# Patient Record
Sex: Male | Born: 1980
Health system: Southern US, Community
[De-identification: ages and names within clinical notes are randomized; demographics above are authoritative.]

## PROBLEM LIST (undated history)

## (undated) DIAGNOSIS — T7840XA Allergy, unspecified, initial encounter: Secondary | ICD-10-CM

## (undated) HISTORY — DX: Allergy, unspecified, initial encounter: T78.40XA

## (undated) HISTORY — PX: TONSILLECTOMY: SUR1361

## (undated) HISTORY — PX: WISDOM TOOTH EXTRACTION: SHX21

---

## 2006-06-22 DIAGNOSIS — L309 Dermatitis, unspecified: Secondary | ICD-10-CM | POA: Insufficient documentation

## 2010-09-12 ENCOUNTER — Inpatient Hospital Stay (INDEPENDENT_AMBULATORY_CARE_PROVIDER_SITE_OTHER)
Admission: RE | Admit: 2010-09-12 | Discharge: 2010-09-12 | Disposition: A | Discharge status: Home / Self Care | Payer: 59 | Source: Ambulatory Visit | Attending: Family Medicine | Admitting: Family Medicine

## 2010-09-12 DIAGNOSIS — R197 Diarrhea, unspecified: Secondary | ICD-10-CM

## 2010-09-12 LAB — POCT I-STAT, CHEM 8
Creatinine, Ser: 1.1 mg/dL (ref 0.4–1.5)
HCT: 45 % (ref 39.0–52.0)
Hemoglobin: 15.3 g/dL (ref 13.0–17.0)
Potassium: 4.1 mEq/L (ref 3.5–5.1)
Sodium: 140 mEq/L (ref 135–145)
TCO2: 28 mmol/L (ref 0–100)

## 2010-09-12 LAB — POCT URINALYSIS DIP (DEVICE)
Glucose, UA: NEGATIVE mg/dL
Nitrite: NEGATIVE
Protein, ur: NEGATIVE mg/dL
Urobilinogen, UA: 0.2 mg/dL (ref 0.0–1.0)

## 2014-03-08 ENCOUNTER — Encounter: Payer: Self-pay | Admitting: Family Medicine

## 2014-03-08 ENCOUNTER — Ambulatory Visit (INDEPENDENT_AMBULATORY_CARE_PROVIDER_SITE_OTHER): Payer: 59 | Admitting: Family Medicine

## 2014-03-08 VITALS — BP 98/68 | HR 64 | Temp 98.1°F | Ht 65.0 in | Wt 155.6 lb

## 2014-03-08 DIAGNOSIS — Z0000 Encounter for general adult medical examination without abnormal findings: Secondary | ICD-10-CM

## 2014-03-08 DIAGNOSIS — J309 Allergic rhinitis, unspecified: Secondary | ICD-10-CM | POA: Insufficient documentation

## 2014-03-08 DIAGNOSIS — Z7689 Persons encountering health services in other specified circumstances: Secondary | ICD-10-CM

## 2014-03-08 DIAGNOSIS — J3089 Other allergic rhinitis: Secondary | ICD-10-CM

## 2014-03-08 LAB — LIPID PANEL
CHOLESTEROL: 213 mg/dL — AB (ref 0–200)
HDL: 39.3 mg/dL (ref >39.00)
LDL CALC: 139 mg/dL — AB (ref 0–99)
NonHDL: 173.7
Total CHOL/HDL Ratio: 5
Triglycerides: 176 mg/dL — ABNORMAL HIGH (ref 0.0–149.0)
VLDL: 35.2 mg/dL (ref 0.0–40.0)

## 2014-03-08 LAB — HEMOGLOBIN A1C: HEMOGLOBIN A1C: 5.1 % (ref 4.6–6.5)

## 2014-03-08 NOTE — Progress Notes (Signed)
Pre visit review using our clinic review tool, if applicable. No additional management support is needed unless otherwise documented below in the visit note. 

## 2014-03-08 NOTE — Progress Notes (Signed)
HPI:  Robert Roman is here to establish care. Wife advised him to get a family doctor. Wants general physical labs. Reports exercises on a regular basis and eats a healthy diet. UTD on vaccines and does declines STI testing. Is fasting today. Last PCP and physical: 4 years ago  Has the following chronic problems that require follow up and concerns today:  Seasonal Allergies: -uses Claritin for this -chronic nasal congestion, sneezing, itchy throat - used to see an ENT  ROS negative for unless reported above: fevers, unintentional weight loss, hearing or vision loss, chest pain, palpitations, struggling to breath, hemoptysis, melena, hematochezia, hematuria, falls, loc, si, thoughts of self harm  Past Medical History  Diagnosis Date  . Allergy     History reviewed. No pertinent past surgical history.  Family History  Problem Relation Age of Onset  . Thyroid disease Mother   . Irritable bowel syndrome Mother   . Hypertension Sister   . Heart disease Maternal Grandfather   . Heart disease Paternal Grandfather     History   Social History  . Marital Status: Married    Spouse Name: N/A    Number of Children: N/A  . Years of Education: N/A   Social History Main Topics  . Smoking status: Never Smoker   . Smokeless tobacco: None     Comment: smoked as a teenager  . Alcohol Use: 0.0 oz/week    0 Not specified per week     Comment: 2-3 drinks a few days per week  . Drug Use: No  . Sexual Activity: None   Other Topics Concern  . 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  GENERAL: vitals reviewed and listed above, alert, oriented, appears well hydrated and in no acute distress  HEENT: atraumatic, conjunttiva clear, no obvious abnormalities on inspection of external nose and ears  NECK: no obvious masses on inspection  LUNGS: clear to auscultation bilaterally, no wheezes, rales or rhonchi, good air movement  CV: HRRR, no peripheral   GU: declined  Rectal: declined  ABD: BS+, soft, NTTP  SKin: normal, reports he saw dermatologist for skin check recently  MS: moves all extremities without noticeable abnormality  PSYCH: pleasant and cooperative, no obvious depression or anxiety  ASSESSMENT AND PLAN:  Discussed the following assessment and plan:  Visit for preventive health examination - Plan: Lipid Panel, Hemoglobin A1c  Encounter to establish care  Other allergic rhinitis  -We reviewed the PMH, PSH, FH, SH, Meds and Allergies. -We provided refills for any medications we will prescribe as needed. -We addressed current concerns per orders and patient instructions. -We have asked for records for pertinent exams, studies, vaccines and notes from previous providers. -We have advised patient to follow up per instructions below. -reviewed all USPSTF level a and b recs -FASTING labs  -Patient advised to return or notify a doctor immediately if  symptoms worsen or persist or new concerns arise.  Patient Instructions  -We have ordered labs or studies at this visit. It can take up to 1-2 weeks for results and processing. We will contact you with instructions IF your results are abnormal. Normal results will be released to your [facility]. If you have not heard from [place] or can not find your results in [facility] in 2 weeks please contact our office.  -PLEASE SIGN UP FOR MYCHART TODAY   We recommend the following healthy lifestyle measures: - eat a healthy diet consisting of lots of  vegetables, fruits, beans, nuts, seeds, healthy meats such as white chicken and fish and whole grains.  - avoid fried foods, fast food, processed foods, sodas, red meet and other fattening foods.  - get a least 150 minutes of aerobic exercise per week.       Colin Benton R.

## 2014-03-08 NOTE — Patient Instructions (Signed)
-  We have ordered labs or studies at this visit. It can take up to 1-2 weeks for results and processing. We will contact you with instructions IF your results are abnormal. Normal results will be released to your MYCHART. If you have not heard from us or can not find your results in MYCHART in 2 weeks please contact our office.  -PLEASE SIGN UP FOR MYCHART TODAY   We recommend the following healthy lifestyle measures: - eat a healthy diet consisting of lots of vegetables, fruits, beans, nuts, seeds, healthy meats such as white chicken and fish and whole grains.  - avoid fried foods, fast food, processed foods, sodas, red meet and other fattening foods.  - get a least 150 minutes of aerobic exercise per week.    

## 2014-07-04 ENCOUNTER — Other Ambulatory Visit (HOSPITAL_COMMUNITY): Payer: Self-pay | Admitting: Rheumatology

## 2014-07-04 DIAGNOSIS — M79642 Pain in left hand: Secondary | ICD-10-CM

## 2014-07-17 ENCOUNTER — Ambulatory Visit (HOSPITAL_COMMUNITY)
Admission: RE | Admit: 2014-07-17 | Discharge: 2014-07-17 | Disposition: A | Discharge status: Home / Self Care | Payer: 59 | Source: Ambulatory Visit | Attending: Rheumatology | Admitting: Rheumatology

## 2014-07-17 DIAGNOSIS — M79642 Pain in left hand: Secondary | ICD-10-CM | POA: Diagnosis not present

## 2014-12-18 ENCOUNTER — Ambulatory Visit (INDEPENDENT_AMBULATORY_CARE_PROVIDER_SITE_OTHER): Payer: 59 | Admitting: Family Medicine

## 2014-12-18 ENCOUNTER — Encounter: Payer: Self-pay | Admitting: Family Medicine

## 2014-12-18 VITALS — BP 90/70 | HR 66 | Temp 98.4°F | Ht 65.0 in | Wt 156.5 lb

## 2014-12-18 DIAGNOSIS — L255 Unspecified contact dermatitis due to plants, except food: Secondary | ICD-10-CM

## 2014-12-18 MED ORDER — PREDNISONE 10 MG PO TABS: ORAL_TABLET | ORAL | Status: DC

## 2014-12-18 NOTE — Progress Notes (Signed)
Pre visit review using our clinic review tool, if applicable. No additional management support is needed unless otherwise documented below in the visit note. 

## 2014-12-18 NOTE — Patient Instructions (Signed)
-  As we discussed, we have prescribed a new medication for you at this appointment. We discussed the common and serious potential adverse effects of this medication and you can review these and more with the pharmacist when you pick up your medication.  Please follow the instructions for use carefully and notify us immediately if you have any problems taking this medication.  Poison Ivy Poison ivy is a inflammation of the skin (contact dermatitis) caused by touching the allergens on the leaves of the ivy plant following previous exposure to the plant. The rash usually appears 48 hours after exposure. The rash is usually bumps (papules) or blisters (vesicles) in a linear pattern. Depending on your own sensitivity, the rash may simply cause redness and itching, or it may also progress to blisters which may break open. These must be well cared for to prevent secondary bacterial (germ) infection, followed by scarring. Keep any open areas dry, clean, dressed, and covered with an antibacterial ointment if needed. The eyes may also get puffy. The puffiness is worst in the morning and gets better as the day progresses. This dermatitis usually heals without scarring, within 2 to 3 weeks without treatment. HOME CARE INSTRUCTIONS  Thoroughly wash with soap and water as soon as you have been exposed to poison ivy. You have about one half hour to remove the plant resin before it will cause the rash. This washing will destroy the oil or antigen on the skin that is causing, or will cause, the rash. Be sure to wash under your fingernails as any plant resin there will continue to spread the rash. Do not rub skin vigorously when washing affected area. Poison ivy cannot spread if no oil from the plant remains on your body. A rash that has progressed to weeping sores will not spread the rash unless you have not washed thoroughly. It is also important to wash any clothes you have been wearing as these may carry active allergens. The  rash will return if you wear the unwashed clothing, even several days later. Avoidance of the plant in the future is the best measure. Poison ivy plant can be recognized by the number of leaves. Generally, poison ivy has three leaves with flowering branches on a single stem. Diphenhydramine may be purchased over the counter and used as needed for itching. Do not drive with this medication if it makes you drowsy.Ask your caregiver about medication for children. SEEK MEDICAL CARE IF:  Open sores develop.  Redness spreads beyond area of rash.  You notice purulent (pus-like) discharge.  You have increased pain.  Other signs of infection develop (such as fever). Document Released: 04/03/2000 Document Revised: 06/29/2011 Document Reviewed: 09/14/2008 [employer] Patient Information 2015 [employer]. This information is not intended to replace advice given to you by your health care provider. Make sure you discuss any questions you have with your health care provider.  

## 2014-12-18 NOTE — Progress Notes (Signed)
HPI:  Acute visit for:  ? Poison Ivy: -rash started in last week after clearing poison ivy from deck -itchy vesiculopapular rash on arms and legs -no lesions on mucus membranes, no SOB or swelling of face or neck -has required steroid in the past  ROS: See pertinent positives and negatives per HPI.  Past Medical History  Diagnosis Date  . Allergy     No past surgical history on file.  Family History  Problem Relation Age of Onset  . Thyroid disease Mother   . Irritable bowel syndrome Mother   . Hypertension Sister   . Heart disease Maternal Grandfather   . Heart disease Paternal Grandfather     Social History   Social History  . Marital Status: Married    Spouse Name: N/A  . Number of Children: N/A  . Years of Education: N/A   Social History Main Topics  . Smoking status: Never Smoker   . Smokeless tobacco: None     Comment: smoked as a teenager  . Alcohol Use: 0.0 oz/week    0 Standard drinks or equivalent per week     Comment: 2-3 drinks a few days per week  . Drug Use: No  . Sexual Activity: Not Asked   Other Topics Concern  . 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  loratadine (CLARITIN) 10 MG tablet, Take 10 mg by mouth daily., Disp: , Rfl:  .  predniSONE (DELTASONE) 10 MG tablet, 5 tablets (50mg ) daily for 3 days, then 4 tablets (40 mg) daily for 3 days, then 3 tablets (30mg ) daily for 3 days, then 2 tablets (20 mg) daily for 3 days, then 1 tablet (10 mg) daily for 3 days., Disp: 45 tablet, Rfl: 0  EXAM:  Filed Vitals:   12/18/14 0906  BP: 90/70  Pulse: 66  Temp: 98.4 F (36.9 C)    Body mass index is 26.04 kg/(m^2).  GENERAL: vitals reviewed and listed above, alert,  oriented, appears well hydrated and in no acute distress  HEENT: atraumatic, conjunttiva clear, no obvious abnormalities on inspection of external nose and ears  NECK: no obvious masses on inspection  SKIN: vesiculopapular rash in patchy distribution on all extermities  MS: moves all extremities without noticeable abnormality  PSYCH: pleasant and cooperative, no obvious depression or anxiety  ASSESSMENT AND PLAN:  Discussed the following assessment and plan:  Toxicodendron dermatitis  -steroid taper after discussion risks/benefits -Patient advised to return or notify a doctor immediately if symptoms worsen or persist or new concerns arise.  Patient Instructions  -As we discussed, we have prescribed a new medication for you at this appointment. We discussed the common and serious potential adverse effects of this medication and you can review these and more with the pharmacist when you pick up your medication.  Please follow the instructions for use carefully and notify us immediately if you have any problems taking this medication.   Poison [employer] ivy is a inflammation of the skin (contact dermatitis) caused by touching the allergens on the leaves of the ivy plant following previous exposure to the plant. The rash usually appears 48  hours after exposure. The rash is usually bumps (papules) or blisters (vesicles) in a linear pattern. Depending on your own sensitivity, the rash may simply cause redness and itching, or it may also progress to blisters which may break open. These must be well cared for to prevent secondary bacterial (germ) infection, followed by scarring. Keep any open areas dry, clean, dressed, and covered with an antibacterial ointment if needed. The eyes may also get puffy. The puffiness is worst in the morning and gets better as the day progresses. This dermatitis usually heals without scarring, within 2 to 3 weeks without treatment. HOME CARE INSTRUCTIONS  Thoroughly  wash with soap and water as soon as you have been exposed to poison ivy. You have about one half hour to remove the plant resin before it will cause the rash. This washing will destroy the oil or antigen on the skin that is causing, or will cause, the rash. Be sure to wash under your fingernails as any plant resin there will continue to spread the rash. Do not rub skin vigorously when washing affected area. Poison ivy cannot spread if no oil from the plant remains on your body. A rash that has progressed to weeping sores will not spread the rash unless you have not washed thoroughly. It is also important to wash any clothes you have been wearing as these may carry active allergens. The rash will return if you wear the unwashed clothing, even several days later. Avoidance of the plant in the future is the best measure. Poison ivy plant can be recognized by the number of leaves. Generally, poison ivy has three leaves with flowering branches on a single stem. Diphenhydramine may be purchased over the counter and used as needed for itching. Do not drive with this medication if it makes you drowsy.Ask your caregiver about medication for children. SEEK MEDICAL CARE IF:  Open sores develop.  Redness spreads beyond area of rash.  You notice purulent (pus-like) discharge.  You have increased pain.  Other signs of infection develop (such as fever). Document Released: 04/03/2000 Document Revised: 06/29/2011 Document Reviewed: 09/14/2008 [facility] Patient Information 2015 [place], [place]. This information is not intended to replace advice given to you by your health care provider. Make sure you discuss any questions you have with your health care provider.      Colin Benton R.

## 2015-05-22 DIAGNOSIS — J342 Deviated nasal septum: Secondary | ICD-10-CM | POA: Diagnosis not present

## 2015-05-22 DIAGNOSIS — J351 Hypertrophy of tonsils: Secondary | ICD-10-CM | POA: Diagnosis not present

## 2015-05-22 DIAGNOSIS — J358 Other chronic diseases of tonsils and adenoids: Secondary | ICD-10-CM | POA: Diagnosis not present

## 2015-05-22 DIAGNOSIS — J343 Hypertrophy of nasal turbinates: Secondary | ICD-10-CM | POA: Diagnosis not present

## 2015-05-22 DIAGNOSIS — R0683 Snoring: Secondary | ICD-10-CM | POA: Diagnosis not present

## 2015-06-08 DIAGNOSIS — B889 Infestation, unspecified: Secondary | ICD-10-CM | POA: Diagnosis not present

## 2015-06-20 DIAGNOSIS — H1045 Other chronic allergic conjunctivitis: Secondary | ICD-10-CM | POA: Diagnosis not present

## 2015-06-20 DIAGNOSIS — H04123 Dry eye syndrome of bilateral lacrimal glands: Secondary | ICD-10-CM | POA: Diagnosis not present

## 2015-06-20 DIAGNOSIS — H40053 Ocular hypertension, bilateral: Secondary | ICD-10-CM | POA: Diagnosis not present

## 2015-06-20 DIAGNOSIS — H40013 Open angle with borderline findings, low risk, bilateral: Secondary | ICD-10-CM | POA: Diagnosis not present

## 2015-07-08 ENCOUNTER — Other Ambulatory Visit: Payer: Self-pay | Admitting: Otolaryngology

## 2015-07-10 ENCOUNTER — Encounter (HOSPITAL_BASED_OUTPATIENT_CLINIC_OR_DEPARTMENT_OTHER): Payer: Self-pay | Admitting: *Deleted

## 2015-07-18 NOTE — Anesthesia Preprocedure Evaluation (Signed)
Anesthesia Evaluation  Patient identified by MRN, date of birth, ID band Patient awake    Reviewed: Allergy & Precautions, H&P , NPO status , Patient's Chart, lab work & pertinent test results  Airway Mallampati: II  TM Distance: >3 FB Neck ROM: full    Dental no notable dental hx. (+) Dental Advisory Given, Teeth Intact   Pulmonary neg pulmonary ROS,    Pulmonary exam normal breath sounds clear to auscultation       Cardiovascular Exercise Tolerance: Good negative cardio ROS Normal cardiovascular exam Rhythm:regular Rate:Normal     Neuro/Psych negative neurological ROS  negative psych ROS   GI/Hepatic negative GI ROS, Neg liver ROS,   Endo/Other  negative endocrine ROS  Renal/GU negative Renal ROS  negative genitourinary   Musculoskeletal   Abdominal   Peds  Hematology negative hematology ROS (+)   Anesthesia Other Findings   Reproductive/Obstetrics negative OB ROS                             Anesthesia Physical Anesthesia Plan  ASA: I  Anesthesia Plan: General   Post-op Pain Management:    Induction: Intravenous  Airway Management Planned: Oral ETT  Additional Equipment:   Intra-op Plan:   Post-operative Plan: Extubation in OR  Informed Consent: I have reviewed the patients History and Physical, chart, labs and discussed the procedure including the risks, benefits and alternatives for the proposed anesthesia with the patient or authorized representative who has indicated his/her understanding and acceptance.   Dental Advisory Given  Plan Discussed with: CRNA and Surgeon  Anesthesia Plan Comments:         Anesthesia Quick Evaluation

## 2015-07-19 ENCOUNTER — Ambulatory Visit (HOSPITAL_BASED_OUTPATIENT_CLINIC_OR_DEPARTMENT_OTHER): Payer: 59 | Admitting: Anesthesiology

## 2015-07-19 ENCOUNTER — Ambulatory Visit (HOSPITAL_BASED_OUTPATIENT_CLINIC_OR_DEPARTMENT_OTHER)
Admission: RE | Admit: 2015-07-19 | Discharge: 2015-07-19 | Disposition: A | Discharge status: Home / Self Care | Payer: 59 | Source: Ambulatory Visit | Attending: Otolaryngology | Admitting: Otolaryngology

## 2015-07-19 ENCOUNTER — Encounter (HOSPITAL_BASED_OUTPATIENT_CLINIC_OR_DEPARTMENT_OTHER): Payer: Self-pay

## 2015-07-19 ENCOUNTER — Encounter (HOSPITAL_BASED_OUTPATIENT_CLINIC_OR_DEPARTMENT_OTHER)
Admission: RE | Disposition: A | Discharge status: Home / Self Care | Payer: Self-pay | Source: Ambulatory Visit | Attending: Otolaryngology

## 2015-07-19 DIAGNOSIS — J342 Deviated nasal septum: Secondary | ICD-10-CM

## 2015-07-19 DIAGNOSIS — J351 Hypertrophy of tonsils: Secondary | ICD-10-CM | POA: Diagnosis not present

## 2015-07-19 DIAGNOSIS — J3489 Other specified disorders of nose and nasal sinuses: Secondary | ICD-10-CM | POA: Diagnosis not present

## 2015-07-19 DIAGNOSIS — J343 Hypertrophy of nasal turbinates: Secondary | ICD-10-CM | POA: Diagnosis not present

## 2015-07-19 HISTORY — PX: NASAL SEPTOPLASTY W/ TURBINOPLASTY: SHX2070

## 2015-07-19 HISTORY — PX: TONSILLECTOMY: SHX5217

## 2015-07-19 SURGERY — SEPTOPLASTY, NOSE, WITH NASAL TURBINATE REDUCTION
Anesthesia: General | Site: Throat | Laterality: Bilateral

## 2015-07-19 MED ORDER — CEFAZOLIN SODIUM-DEXTROSE 2-4 GM/100ML-% IV SOLN
INTRAVENOUS | Status: AC
  Filled 2015-07-19: qty 100

## 2015-07-19 MED ORDER — FENTANYL CITRATE (PF) 100 MCG/2ML IJ SOLN
25.0000 ug | INTRAMUSCULAR | Status: DC | PRN
  Administered 2015-07-19: 50 ug via INTRAVENOUS
  Administered 2015-07-19: 25 ug via INTRAVENOUS

## 2015-07-19 MED ORDER — OXYMETAZOLINE HCL 0.05 % NA SOLN
NASAL | Status: AC
  Filled 2015-07-19: qty 15

## 2015-07-19 MED ORDER — FENTANYL CITRATE (PF) 100 MCG/2ML IJ SOLN
50.0000 ug | INTRAMUSCULAR | Status: AC | PRN
  Administered 2015-07-19: 100 ug via INTRAVENOUS
  Administered 2015-07-19 (×2): 50 ug via INTRAVENOUS

## 2015-07-19 MED ORDER — BACITRACIN-NEOMYCIN-POLYMYXIN 400-5-5000 EX OINT
TOPICAL_OINTMENT | CUTANEOUS | Status: AC
  Filled 2015-07-19: qty 1

## 2015-07-19 MED ORDER — BACITRACIN ZINC 500 UNIT/GM EX OINT
TOPICAL_OINTMENT | CUTANEOUS | Status: AC
  Filled 2015-07-19: qty 28.35

## 2015-07-19 MED ORDER — PROPOFOL 500 MG/50ML IV EMUL
INTRAVENOUS | Status: AC
  Filled 2015-07-19: qty 50

## 2015-07-19 MED ORDER — LIDOCAINE-EPINEPHRINE 1 %-1:100000 IJ SOLN
INTRAMUSCULAR | Status: AC
  Filled 2015-07-19: qty 1

## 2015-07-19 MED ORDER — MIDAZOLAM HCL 2 MG/2ML IJ SOLN
INTRAMUSCULAR | Status: AC
  Filled 2015-07-19: qty 2

## 2015-07-19 MED ORDER — LACTATED RINGERS IV SOLN
INTRAVENOUS | Status: DC
  Administered 2015-07-19 (×2): via INTRAVENOUS
  Administered 2015-07-19: 10 mL/h via INTRAVENOUS

## 2015-07-19 MED ORDER — LACTATED RINGERS IV SOLN: INTRAVENOUS | Status: DC

## 2015-07-19 MED ORDER — MUPIROCIN 2 % EX OINT
TOPICAL_OINTMENT | CUTANEOUS | Status: DC | PRN
  Administered 2015-07-19: 1 via NASAL

## 2015-07-19 MED ORDER — MORPHINE SULFATE (PF) 2 MG/ML IV SOLN
2.0000 mg | INTRAVENOUS | Status: DC | PRN
  Administered 2015-07-19: 2 mg via INTRAVENOUS
  Filled 2015-07-19: qty 1

## 2015-07-19 MED ORDER — LIDOCAINE-EPINEPHRINE 1 %-1:100000 IJ SOLN
INTRAMUSCULAR | Status: DC | PRN
  Administered 2015-07-19: 6 mL

## 2015-07-19 MED ORDER — ONDANSETRON HCL 4 MG PO TABS: 4.0000 mg | ORAL_TABLET | ORAL | Status: DC | PRN

## 2015-07-19 MED ORDER — FENTANYL CITRATE (PF) 100 MCG/2ML IJ SOLN
INTRAMUSCULAR | Status: AC
  Filled 2015-07-19: qty 2

## 2015-07-19 MED ORDER — DEXAMETHASONE SODIUM PHOSPHATE 10 MG/ML IJ SOLN
10.0000 mg | Freq: Three times a day (TID) | INTRAMUSCULAR | Status: DC
Start: 2015-07-19 — End: 2015-07-19
  Administered 2015-07-19: 10 mg via INTRAVENOUS
  Filled 2015-07-19: qty 1

## 2015-07-19 MED ORDER — SUGAMMADEX SODIUM 200 MG/2ML IV SOLN
INTRAVENOUS | Status: DC | PRN
  Administered 2015-07-19: 200 mg via INTRAVENOUS

## 2015-07-19 MED ORDER — HYDROCODONE-ACETAMINOPHEN 7.5-325 MG/15ML PO SOLN: 10.0000 mL | ORAL | Status: DC | PRN

## 2015-07-19 MED ORDER — AMOXICILLIN-POT CLAVULANATE 250-62.5 MG/5ML PO SUSR: 10.0000 mL | Freq: Two times a day (BID) | ORAL | Status: DC

## 2015-07-19 MED ORDER — LIDOCAINE HCL (CARDIAC) 20 MG/ML IV SOLN
INTRAVENOUS | Status: AC
  Filled 2015-07-19: qty 5

## 2015-07-19 MED ORDER — DEXTROSE 5 % IV SOLN
2.0000 g | INTRAVENOUS | Status: AC
  Administered 2015-07-19: 2 g via INTRAVENOUS

## 2015-07-19 MED ORDER — OXYMETAZOLINE HCL 0.05 % NA SOLN
NASAL | Status: DC | PRN
  Administered 2015-07-19: 1 via TOPICAL

## 2015-07-19 MED ORDER — BACITRACIN ZINC 500 UNIT/GM EX OINT: 1.0000 "application " | TOPICAL_OINTMENT | Freq: Three times a day (TID) | CUTANEOUS | Status: DC

## 2015-07-19 MED ORDER — DEXAMETHASONE SODIUM PHOSPHATE 10 MG/ML IJ SOLN
INTRAMUSCULAR | Status: AC
  Filled 2015-07-19: qty 1

## 2015-07-19 MED ORDER — BACITRACIN ZINC 500 UNIT/GM EX OINT
TOPICAL_OINTMENT | CUTANEOUS | Status: AC
  Filled 2015-07-19: qty 2.7

## 2015-07-19 MED ORDER — SCOPOLAMINE 1 MG/3DAYS TD PT72
1.0000 | MEDICATED_PATCH | Freq: Once | TRANSDERMAL | Status: DC | PRN
Start: 2015-07-19 — End: 2015-07-19

## 2015-07-19 MED ORDER — MIDAZOLAM HCL 2 MG/2ML IJ SOLN
1.0000 mg | INTRAMUSCULAR | Status: DC | PRN
  Administered 2015-07-19: 2 mg via INTRAVENOUS

## 2015-07-19 MED ORDER — LIDOCAINE HCL (CARDIAC) 20 MG/ML IV SOLN
INTRAVENOUS | Status: DC | PRN
  Administered 2015-07-19: 80 mg via INTRAVENOUS

## 2015-07-19 MED ORDER — DEXTROSE IN LACTATED RINGERS 5 % IV SOLN
INTRAVENOUS | Status: DC
  Administered 2015-07-19: 10:00:00 via INTRAVENOUS

## 2015-07-19 MED ORDER — GLYCOPYRROLATE 0.2 MG/ML IJ SOLN: 0.2000 mg | Freq: Once | INTRAMUSCULAR | Status: DC | PRN

## 2015-07-19 MED ORDER — ONDANSETRON HCL 4 MG/2ML IJ SOLN
INTRAMUSCULAR | Status: AC
  Filled 2015-07-19: qty 2

## 2015-07-19 MED ORDER — ROCURONIUM BROMIDE 100 MG/10ML IV SOLN
INTRAVENOUS | Status: DC | PRN
  Administered 2015-07-19: 35 mg via INTRAVENOUS

## 2015-07-19 MED ORDER — ONDANSETRON HCL 4 MG/2ML IJ SOLN
INTRAMUSCULAR | Status: DC | PRN
  Administered 2015-07-19: 4 mg via INTRAVENOUS

## 2015-07-19 MED ORDER — ROCURONIUM BROMIDE 50 MG/5ML IV SOLN
INTRAVENOUS | Status: AC
  Filled 2015-07-19: qty 1

## 2015-07-19 MED ORDER — HEPARIN SODIUM (PORCINE) 5000 UNIT/ML IJ SOLN: 5000.0000 [IU] | Freq: Three times a day (TID) | INTRAMUSCULAR | Status: DC

## 2015-07-19 MED ORDER — ONDANSETRON HCL 4 MG/2ML IJ SOLN: 4.0000 mg | INTRAMUSCULAR | Status: DC | PRN

## 2015-07-19 MED ORDER — DEXAMETHASONE SODIUM PHOSPHATE 4 MG/ML IJ SOLN: INTRAMUSCULAR | Status: DC | PRN

## 2015-07-19 MED ORDER — DEXAMETHASONE SODIUM PHOSPHATE 10 MG/ML IJ SOLN
10.0000 mg | Freq: Once | INTRAMUSCULAR | Status: AC
  Administered 2015-07-19: 10 mg via INTRAVENOUS

## 2015-07-19 MED ORDER — PROPOFOL 10 MG/ML IV BOLUS
INTRAVENOUS | Status: DC | PRN
  Administered 2015-07-19: 200 mg via INTRAVENOUS

## 2015-07-19 MED ORDER — HYDROCODONE-ACETAMINOPHEN 7.5-325 MG/15ML PO SOLN
10.0000 mL | ORAL | Status: DC | PRN
  Administered 2015-07-19 (×2): 15 mL via ORAL
  Filled 2015-07-19 (×2): qty 15

## 2015-07-19 MED ORDER — PHENOL 1.4 % MT LIQD
1.0000 | OROMUCOSAL | Status: DC | PRN
  Filled 2015-07-19: qty 177

## 2015-07-19 MED ORDER — MUPIROCIN 2 % EX OINT
TOPICAL_OINTMENT | CUTANEOUS | Status: AC
  Filled 2015-07-19: qty 22

## 2015-07-19 MED FILL — AMOX TR-K CLV 250-62.5/5 SU: 250-62.5 | 10 days supply | Qty: 200 | Fill #0

## 2015-07-19 MED FILL — HYDROCOD-APAP 7.5-325/15ML: 7.5-325 | 5 days supply | Qty: 473 | Fill #0

## 2015-07-19 SURGICAL SUPPLY — 52 items
ATTRACTOMAT 16X20 MAGNETIC DRP (DRAPES) ×3 IMPLANT
BLADE SURG 15 STRL LF DISP TIS (BLADE) ×2 IMPLANT
BLADE SURG 15 STRL SS (BLADE) ×1
CANISTER SUCT 1200ML W/VALVE (MISCELLANEOUS) ×3 IMPLANT
CLEANER CAUTERY TIP 5X5 PAD (MISCELLANEOUS) ×2 IMPLANT
COAGULATOR SUCT 8FR VV (MISCELLANEOUS) IMPLANT
COAGULATOR SUCT SWTCH 10FR 6 (ELECTROSURGICAL) ×3 IMPLANT
COVER MAYO STAND STRL (DRAPES) ×3 IMPLANT
DECANTER SPIKE VIAL GLASS SM (MISCELLANEOUS) IMPLANT
DRSG NASOPORE 8CM (GAUZE/BANDAGES/DRESSINGS) IMPLANT
DRSG TELFA 3X8 NADH (GAUZE/BANDAGES/DRESSINGS) ×3 IMPLANT
ELECT COATED BLADE 2.86 ST (ELECTRODE) ×3 IMPLANT
ELECT REM PT RETURN 9FT ADLT (ELECTROSURGICAL) ×3
ELECT REM PT RETURN 9FT PED (ELECTROSURGICAL)
ELECTRODE REM PT RETRN 9FT PED (ELECTROSURGICAL) IMPLANT
ELECTRODE REM PT RTRN 9FT ADLT (ELECTROSURGICAL) ×2 IMPLANT
GLOVE BIOGEL M 7.0 STRL (GLOVE) ×3 IMPLANT
GLOVE BIOGEL M STRL SZ7.5 (GLOVE) ×3 IMPLANT
GLOVE BIOGEL PI IND STRL 8 (GLOVE) ×2 IMPLANT
GLOVE BIOGEL PI INDICATOR 8 (GLOVE) ×1
GLOVE SURG SS PI 8.0 STRL IVOR (GLOVE) ×3 IMPLANT
GOWN STRL REUS W/ TWL LRG LVL3 (GOWN DISPOSABLE) ×2 IMPLANT
GOWN STRL REUS W/ TWL XL LVL3 (GOWN DISPOSABLE) ×4 IMPLANT
GOWN STRL REUS W/TWL LRG LVL3 (GOWN DISPOSABLE) ×1
GOWN STRL REUS W/TWL XL LVL3 (GOWN DISPOSABLE) ×2
IV SET EXT 30 76VOL 4 MALE LL (IV SETS) ×3 IMPLANT
MARKER SKIN DUAL TIP RULER LAB (MISCELLANEOUS) IMPLANT
NEEDLE PRECISIONGLIDE 27X1.5 (NEEDLE) ×3 IMPLANT
NS IRRIG 1000ML POUR BTL (IV SOLUTION) ×3 IMPLANT
PACK BASIN DAY SURGERY FS (CUSTOM PROCEDURE TRAY) ×3 IMPLANT
PACK ENT DAY SURGERY (CUSTOM PROCEDURE TRAY) ×3 IMPLANT
PAD CLEANER CAUTERY TIP 5X5 (MISCELLANEOUS) ×1
PENCIL BUTTON HOLSTER BLD 10FT (ELECTRODE) ×3 IMPLANT
PIN SAFETY STERILE (MISCELLANEOUS) ×3 IMPLANT
SHEET MEDIUM DRAPE 40X70 STRL (DRAPES) ×3 IMPLANT
SLEEVE SCD COMPRESS KNEE MED (MISCELLANEOUS) ×3 IMPLANT
SOLUTION BUTLER CLEAR DIP (MISCELLANEOUS) ×3 IMPLANT
SPLINT NASAL AIRWAY SILICONE (MISCELLANEOUS) ×3 IMPLANT
SPONGE GAUZE 2X2 8PLY STRL LF (GAUZE/BANDAGES/DRESSINGS) ×3 IMPLANT
SPONGE GAUZE 4X4 12PLY STER LF (GAUZE/BANDAGES/DRESSINGS) ×3 IMPLANT
SPONGE NEURO XRAY DETECT 1X3 (DISPOSABLE) ×3 IMPLANT
SPONGE SURGIFOAM ABS GEL 12-7 (HEMOSTASIS) IMPLANT
SPONGE TONSIL 1 RF SGL (DISPOSABLE) IMPLANT
SPONGE TONSIL 1.25 RF SGL STRG (GAUZE/BANDAGES/DRESSINGS) ×3 IMPLANT
SUT ETHILON 3 0 PS 1 (SUTURE) ×3 IMPLANT
SUT PLAIN 4 0 ~~LOC~~ 1 (SUTURE) ×3 IMPLANT
SYR BULB 3OZ (MISCELLANEOUS) ×3 IMPLANT
TOWEL OR 17X24 6PK STRL BLUE (TOWEL DISPOSABLE) ×3 IMPLANT
TUBE CONNECTING 20X1/4 (TUBING) ×3 IMPLANT
TUBE SALEM SUMP 12R W/ARV (TUBING) IMPLANT
TUBE SALEM SUMP 16 FR W/ARV (TUBING) ×3 IMPLANT
YANKAUER SUCT BULB TIP NO VENT (SUCTIONS) ×3 IMPLANT

## 2015-07-19 NOTE — Transfer of Care (Signed)
Immediate Anesthesia Transfer of Care Note  Patient: Robert Roman  Procedure(s) Performed: Procedure(s): NASAL SEPTOPLASTY WITH BILATERAL INFERIOR TURBINATE REDUCTION (Bilateral) BILATERAL TONSILLECTOMY (Bilateral)  Patient Location: PACU  Anesthesia Type:General  Level of Consciousness: sedated  Airway & Oxygen Therapy: Patient Spontanous Breathing and Patient connected to face mask oxygen  Post-op Assessment: Report given to RN and Post -op Vital signs reviewed and stable  Post vital signs: Reviewed and stable  Last Vitals:  Filed Vitals:   07/19/15 0632  BP: 122/79  Pulse: 66  Temp: 36.8 C  Resp: 18    Complications: No apparent anesthesia complications

## 2015-07-19 NOTE — Brief Op Note (Signed)
07/19/2015  8:51 AM  PATIENT:  Robert Roman  35 y.o. 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  PROCEDURE:  Procedure(s): NASAL SEPTOPLASTY WITH BILATERAL INFERIOR TURBINATE REDUCTION (Bilateral) BILATERAL TONSILLECTOMY (Bilateral)  SURGEON:  Surgeon(s) and Role:    * Jerrell Belfast, MD - Primary  PHYSICIAN ASSISTANT:   ASSISTANTS: none   ANESTHESIA:   general  EBL:  Total I/O In: 1000 [I.V.:1000] Out: 50 [Blood:50]  BLOOD ADMINISTERED:none  DRAINS: none   LOCAL MEDICATIONS USED:  LIDOCAINE  and Amount: 6 ml  SPECIMEN:  Source of Specimen:  tonsils  DISPOSITION OF SPECIMEN:  PATHOLOGY  COUNTS:  YES  TOURNIQUET:  * No tourniquets in log *  DICTATION: .Other Dictation: Dictation Number [id number]  PLAN OF CARE: Admit for overnight observation  PATIENT DISPOSITION:  PACU - hemodynamically stable.   Delay start of Pharmacological VTE agent (>24hrs) due to surgical blood loss or risk of bleeding: no

## 2015-07-19 NOTE — H&P (Signed)
Robert Roman is an 35 y.o. male.   Chief Complaint: Nasal obstruction and snoring HPI: Prog Nasal obstruction and snoring  Past Medical History  Diagnosis Date  . Allergy     Past Surgical History  Procedure Laterality Date  . Wisdom tooth extraction      Family History  Problem Relation Age of Onset  . Thyroid disease Mother   . Irritable bowel syndrome Mother   . Hypertension Sister   . Heart disease Maternal Grandfather   . Heart disease Paternal Grandfather    Social History:  reports that he has never smoked. He does not have any smokeless tobacco history on file. He reports that he drinks alcohol. He reports that he does not use illicit drugs.  Allergies: No Known Allergies  Medications Prior to Admission  Medication Sig Dispense Refill  . loratadine (CLARITIN) 10 MG tablet Take 10 mg by mouth daily.      No results found for this or any previous visit (from the past 48 hour(s)). No results found.  Review of Systems  Constitutional: Negative.   HENT: Negative.   Respiratory: Negative.   Cardiovascular: Negative.   Gastrointestinal: Negative.     Blood pressure 122/79, pulse 66, temperature 98.2 F (36.8 C), temperature source Oral, resp. rate 18, height 5\' 6"  (1.676 m), weight 70.761 kg (156 lb), SpO2 99 %. Physical Exam  Constitutional: He appears well-developed and well-nourished.  HENT:  Nasal obstruction Tonsil hypertrophy  Neck: Normal range of motion. Neck supple.  Cardiovascular: Normal rate.   Respiratory: Effort normal.  GI: Soft.     Assessment/Plan Adm for septoplasty, IT reduction and tonsillectomy  Nimrit Kehres, MD 07/19/2015, 7:33 AM

## 2015-07-19 NOTE — Anesthesia Procedure Notes (Signed)
Procedure Name: Intubation Date/Time: 07/19/2015 7:53 AM Performed by: Maryella Shivers Pre-anesthesia Checklist: Patient identified, Emergency Drugs available, Suction available and Patient being monitored Patient Re-evaluated:Patient Re-evaluated prior to inductionOxygen Delivery Method: Circle System Utilized Preoxygenation: Pre-oxygenation with 100% oxygen Intubation Type: IV induction Ventilation: Mask ventilation without difficulty Laryngoscope Size: Mac and 3 Grade View: Grade II Tube type: Oral Tube size: 7.0 mm Number of attempts: 1 Airway Equipment and Method: Stylet and Oral airway Placement Confirmation: ETT inserted through vocal cords under direct vision,  positive ETCO2 and breath sounds checked- equal and bilateral Secured at: 22 cm Tube secured with: Tape Dental Injury: Teeth and Oropharynx as per pre-operative assessment

## 2015-07-19 NOTE — Discharge Instructions (Signed)

## 2015-07-19 NOTE — Anesthesia Postprocedure Evaluation (Signed)
Anesthesia Post Note  Patient: Robert Roman  Procedure(s) Performed: Procedure(s) (LRB): NASAL SEPTOPLASTY WITH BILATERAL INFERIOR TURBINATE REDUCTION (Bilateral) BILATERAL TONSILLECTOMY (Bilateral)  Patient location during evaluation: PACU Anesthesia Type: General Level of consciousness: awake and alert Pain management: pain level controlled Vital Signs Assessment: post-procedure vital signs reviewed and stable Respiratory status: spontaneous breathing, nonlabored ventilation, respiratory function stable and patient connected to nasal cannula oxygen Cardiovascular status: blood pressure returned to baseline and stable Postop Assessment: no signs of nausea or vomiting Anesthetic complications: no    Last Vitals:  Filed Vitals:   07/19/15 0945 07/19/15 0953  BP: 124/83   Pulse: 73 60  Temp:  36.6 C  Resp: 18 13    Last Pain:  Filed Vitals:   07/19/15 1032  PainSc: 6                  Allex Lapoint L

## 2015-07-22 ENCOUNTER — Encounter (HOSPITAL_BASED_OUTPATIENT_CLINIC_OR_DEPARTMENT_OTHER): Payer: Self-pay | Admitting: Otolaryngology

## 2015-07-22 LAB — POCT HEMOGLOBIN-HEMACUE: HEMOGLOBIN: 14.9 g/dL (ref 13.0–17.0)

## 2015-07-22 NOTE — Op Note (Signed)
NAMEMORTY, SCHAAL                  ACCOUNT NO.:  [id number]  MEDICAL RECORD NO.:  [id number]  LOCATION:                                 FACILITY:  PHYSICIAN:  Early Chars. Wilburn Cornelia, M.D.DATE OF BIRTH:  10-30-1980  DATE OF PROCEDURE:  07/19/2015 DATE OF DISCHARGE:                              OPERATIVE REPORT   LOCATION:  [place].  PREOPERATIVE DIAGNOSES: 1. Deviated nasal septum with airway obstruction. 2. Bilateral inferior nasal turbinate hypertrophy. 3. Tonsillar hypertrophy. 4. Heavy nighttime snoring.  POSTOPERATIVE DIAGNOSES: 1. Deviated nasal septum with airway obstruction. 2. Bilateral inferior nasal turbinate hypertrophy. 3. Tonsillar hypertrophy. 4. Heavy nighttime snoring.  INDICATION FOR SURGERY: 1. Deviated nasal septum with airway obstruction. 2. Bilateral inferior nasal turbinate hypertrophy. 3. Tonsillar hypertrophy. 4. Heavy nighttime snoring.  SURGICAL PROCEDURES: 1. Nasal septoplasty. 2. Bilateral inferior turbinate reduction. 3. Tonsillectomy.  ANESTHESIA:  General endotracheal.  COMPLICATIONS:  There were no complications.  BLOOD LOSS:  Less than 50 mL.  DISPOSITION:  The patient transferred from the operating room to the recovery room in stable condition.  BRIEF HISTORY:  The patient is an otherwise healthy 35 year old male who was referred to our office for evaluation of progressive symptoms of nasal airway obstruction.  Examination shows severely deviated nasal septum with near complete left-sided airway obstruction and bilateral turbinate hypertrophy.  The patient has a history of significant nighttime snoring.  No active sleep apnea.  Examination showed 3+ cryptic tonsils.  No recent infection or tonsil discharge.  Given the patient's history and physical findings, we discussed treatment options. He did use topical nasal steroids without good result and treatment for allergies and infection.  The patient  continued to have ongoing symptoms.  Given that history, I recommended the above surgical procedures including septoplasty, turbinate reduction, and tonsillectomy.  The risks and benefits of the procedure were discussed in detail with the patient and his wife, and they understood and agreed with our plan for surgery, which is scheduled on elective basis at the [facility].  DESCRIPTION OF PROCEDURE:  The patient was brought to the operating room, placed in the supine position on the operating table.  General endotracheal anesthesia was established without difficulty.  When the patient was adequately anesthetized, he was positioned on the operating table, and prepped and draped in a sterile fashion.  A surgical time-out was performed with the correct identification of the patient and the surgical procedures.  The patient's nose was then injected with a total of 6 mL of 1% lidocaine with 1:100,000 solution of epinephrine was injected in a submucosal fashion along the nasal septum and inferior turbinates bilaterally.  The packing was left in place for approximately 10 minutes to allow for vasoconstriction and hemostasis.  The patient was prepped and draped and prepared for surgery.  The nasal septoplasty was performed by creating a left anterior hemitransfixion incision, which was carried through the mucosa and underlying submucosa. Mucoperichondrial flap was elevated on the left-hand side.  The anterior cartilaginous septum was crossed and a mucoperichondrial flap was elevated on the right.  Deviated bone and cartilage were then resected beginning  in the anterior septal cartilage, which was removed and later morselized and returned to the mucoperichondrial pocket.  Dissection was then carried from anterior to posterior along the nasal septum removing deviated bone and cartilage including a large bony septal spur on the left-hand side, which was mobilized and  resected.  At the conclusion of this portion of the procedure, the morselized cartilage was returned to the mucoperichondrial pocket and the flaps were reapproximated with a 4- 0 gut suture on a Keith needle in a horizontal mattress fashion. Bilateral Doyle nasal septal splints were placed at the conclusion of the surgical procedure and were sutured in position with a 3-0 Ethilon suture after application of Bactroban ointment.  Inferior turbinate reduction was then performed with cautery set at 12 watts.  Two submucosal passes were made in each inferior turbinate, and turbinate incision was then created bilaterally.  Soft tissue was elevated, small amount of turbinate bone was resected, and the turbinates were outfractured to create a more patent nasal passageway.  Tonsillectomy was then performed.  The Crowe-Davis mouth gag was inserted without difficulty.  There were no loose or broken teeth.  Hard and soft palates were intact.  The left tonsil was resected using Bovie electrocautery and dissection in a subcapsular fashion.  The entire left tonsil was removed from superior pole to tongue base.  Right tonsil was removed in a similar fashion.  The tonsil tissue was sent to Pathology for gross microscopic evaluation.  A dry tonsil sponge was used to gently abrade the tonsillar fossa and several areas of point hemorrhage were then cauterized with suction cautery.  The mouth gag was released and reapplied.  There was no active bleeding.  The nasal cavity and oropharynx were then irrigated and suctioned.  An orogastric tube was passed, and the stomach contents were aspirated.  The patient was then awakened from his anesthetic, Crowe-Davis mouth gag was released and removed.  No active bleeding.  No broken teeth.  The patient was awakened, extubated, and then transferred from the operating room to the recovery room in stable condition.  There were no complications, and blood loss was less  than 50 mL.         ______________________________ Early Chars. Wilburn Cornelia, M.D.    DLS/MEDQ  D:  [id number]  T:  07/20/2015  Job:  [id number]

## 2015-07-25 DIAGNOSIS — J95830 Postprocedural hemorrhage and hematoma of a respiratory system organ or structure following a respiratory system procedure: Secondary | ICD-10-CM | POA: Insufficient documentation

## 2015-07-27 ENCOUNTER — Emergency Department (HOSPITAL_COMMUNITY): Payer: 59

## 2015-07-27 ENCOUNTER — Emergency Department (HOSPITAL_COMMUNITY)
Admission: EM | Admit: 2015-07-27 | Discharge: 2015-07-27 | Disposition: A | Discharge status: Home / Self Care | Payer: 59 | Attending: Emergency Medicine | Admitting: Emergency Medicine

## 2015-07-27 ENCOUNTER — Encounter (HOSPITAL_COMMUNITY): Payer: Self-pay | Admitting: Emergency Medicine

## 2015-07-27 DIAGNOSIS — R040 Epistaxis: Secondary | ICD-10-CM | POA: Diagnosis not present

## 2015-07-27 DIAGNOSIS — Z792 Long term (current) use of antibiotics: Secondary | ICD-10-CM | POA: Diagnosis not present

## 2015-07-27 DIAGNOSIS — K5900 Constipation, unspecified: Secondary | ICD-10-CM | POA: Insufficient documentation

## 2015-07-27 MED ORDER — FLEET ENEMA 7-19 GM/118ML RE ENEM
1.0000 | ENEMA | Freq: Once | RECTAL | Status: AC
  Administered 2015-07-27: 1 via RECTAL
  Filled 2015-07-27: qty 1

## 2015-07-27 NOTE — Discharge Instructions (Signed)
1. Start taking colace, 1 capsule daily. 2. Start taking miralax, 1 capful mixed in drink, 1 to 3 times daily as needed for soft stools. 3. Take 1 bottle of magnesium citrate today for severe constipation.  Constipation, Adult Constipation is when a person has fewer than three bowel movements a week, has difficulty having a bowel movement, or has stools that are dry, hard, or larger than normal. As people grow older, constipation is more common. A low-fiber diet, not taking in enough fluids, and taking certain medicines may make constipation worse.  CAUSES   Certain medicines, such as antidepressants, pain medicine, iron supplements, antacids, and water pills.   Certain diseases, such as diabetes, irritable bowel syndrome (IBS), thyroid disease, or depression.   Not drinking enough water.   Not eating enough fiber-rich foods.   Stress or travel.   Lack of physical activity or exercise.   Ignoring the urge to have a bowel movement.   Using laxatives too much.  SIGNS AND SYMPTOMS   Having fewer than three bowel movements a week.   Straining to have a bowel movement.   Having stools that are hard, dry, or larger than normal.   Feeling full or bloated.   Pain in the lower abdomen.   Not feeling relief after having a bowel movement.  DIAGNOSIS  Your health care provider will take a medical history and perform a physical exam. Further testing may be done for severe constipation. Some tests may include:  A barium enema X-ray to examine your rectum, colon, and, sometimes, your small intestine.   A sigmoidoscopy to examine your lower colon.   A colonoscopy to examine your entire colon. TREATMENT  Treatment will depend on the severity of your constipation and what is causing it. Some dietary treatments include drinking more fluids and eating more fiber-rich foods. Lifestyle treatments may include regular exercise. If these diet and lifestyle recommendations do not  help, your health care provider may recommend taking over-the-counter laxative medicines to help you have bowel movements. Prescription medicines may be prescribed if over-the-counter medicines do not work.  HOME CARE INSTRUCTIONS   Eat foods that have a lot of fiber, such as fruits, vegetables, whole grains, and beans.  Limit foods high in fat and processed sugars, such as french fries, hamburgers, cookies, candies, and soda.   A fiber supplement may be added to your diet if you cannot get enough fiber from foods.   Drink enough fluids to keep your urine clear or pale yellow.   Exercise regularly or as directed by your health care provider.   Go to the restroom when you have the urge to go. Do not hold it.   Only take over-the-counter or prescription medicines as directed by your health care provider. Do not take other medicines for constipation without talking to your health care provider first.  [place] IF:   You have bright red blood in your stool.   Your constipation lasts for more than 4 days or gets worse.   You have abdominal or rectal pain.   You have thin, pencil-like stools.   You have unexplained weight loss. MAKE SURE YOU:   Understand these instructions.  Will watch your condition.  Will get help right away if you are not doing well or get worse.   This information is not intended to replace advice given to you by your health care provider. Make sure you discuss any questions you have with your health care provider.  Document Released: 01/03/2004 Document Revised: 04/27/2014 Document Reviewed: 01/16/2013 Elsevier Interactive Patient Education [employer].

## 2015-07-27 NOTE — ED Provider Notes (Signed)
CSN: [id number]     Arrival date & time 07/27/15  1229 History   First MD Initiated Contact with Patient 07/27/15 1326     Chief Complaint  Patient presents with  . Constipation     (Consider location/radiation/quality/duration/timing/severity/associated sxs/prior Treatment) HPI Comments: 35 year old male who presents with constipation. The patient had a septoplasty and tonsillectomy 8 days ago and states he has not had a bowel movement since the morning of surgery. He used an enema at home without relief. He has not taken any medications for his constipation. No severe abdominal pain or vomiting. No fevers. He has been taking hydrocodone for his pain but is weaning off to Tylenol. He is recovering appropriately from his surgery and denies any significant problems with swallowing or nose pain.  Patient is a 35 y.o. male presenting with constipation. The history is provided by the patient.  Constipation   Past Medical History  Diagnosis Date  . Allergy    Past Surgical History  Procedure Laterality Date  . Wisdom tooth extraction    . Nasal septoplasty w/ turbinoplasty Bilateral 07/19/2015    Procedure: NASAL SEPTOPLASTY WITH BILATERAL INFERIOR TURBINATE REDUCTION;  Surgeon: Jerrell Belfast, MD;  Location: [facility];  Service: ENT;  Laterality: Bilateral;  . Tonsillectomy Bilateral 07/19/2015    Procedure: BILATERAL TONSILLECTOMY;  Surgeon: Jerrell Belfast, MD;  Location: [facility];  Service: ENT;  Laterality: Bilateral;   Family History  Problem Relation Age of Onset  . Thyroid disease Mother   . Irritable bowel syndrome Mother   . Hypertension Sister   . Heart disease Maternal Grandfather   . Heart disease Paternal Grandfather    Social History  Substance Use Topics  . Smoking status: Never Smoker   . Smokeless tobacco: None     Comment: smoked as a teenager  . Alcohol Use: 0.0 oz/week    0 Standard drinks or equivalent per week     Comment:  2-3 drinks a few days per week    Review of Systems  Gastrointestinal: Positive for constipation.   10 Systems reviewed and are negative for acute change except as noted in the HPI.    Allergies  Review of patient's allergies indicates no known allergies.  Home Medications   Prior to Admission medications   Medication Sig Start Date End Date Taking? Authorizing Provider  amoxicillin-clavulanate (AUGMENTIN) 250-62.5 MG/5ML suspension Take 10 mLs by mouth 2 (two) times daily. 07/19/15   Jerrell Belfast, MD  HYDROcodone-acetaminophen (HYCET) 7.5-325 mg/15 ml solution Take 10-15 mLs by mouth every 4 (four) hours as needed for moderate pain. 07/19/15   Jerrell Belfast, MD   BP 114/68 mmHg  Pulse 75  Temp(Src) 97.4 F (36.3 C) (Oral)  Resp 18  SpO2 98% Physical Exam  Constitutional: He is oriented to person, place, and time. He appears well-developed and well-nourished. No distress.  HENT:  Head: Normocephalic and atraumatic.  Moist mucous membranes, eschar on bilateral tonsil pillars, small amount of blood in left nostril, b/l clear splints in place  Eyes: Conjunctivae are normal. Pupils are equal, round, and reactive to light.  Neck: Neck supple.  Cardiovascular: Normal rate, regular rhythm and normal heart sounds.   No murmur heard. Pulmonary/Chest: Effort normal and breath sounds normal.  Abdominal: Soft. Bowel sounds are normal. He exhibits no distension. There is no tenderness.  Musculoskeletal: He exhibits no edema.  Neurological: He is alert and oriented to person, place, and time.  Fluent speech  Skin: Skin is  warm and dry.  Psychiatric: He has a normal mood and affect. Judgment normal.  Nursing note and vitals reviewed.   ED Course  Procedures (including critical care time) Labs Review Labs Reviewed - No data to display  Imaging Review No results found. I have personally reviewed and evaluated these images as part of my medical decision-making.   MDM   Final  diagnoses:  Constipation, unspecified constipation type    PT w/ 8 days of constipation since Septoplasty and tonsillectomy 8 days ago. He was well-appearing with normal vital signs at presentation. No abdominal tenderness on exam. Bowel sounds present. Nursing performed rectal exam which showed fecal impaction which was relieved manually. She also gave enema which produced a large bowel movement in the ED. I discussed supportive care including MiraLAX and Colace as well as good fluid intake. I also instructed to take magnesium citrate tonight. Patient to follow up with primary surgeon next week in the clinic. Return precautions including vomiting, severe abdominal pain, or signs of bowel obstruction reviewed. Patient voiced understanding and was discharged in satisfactory condition.  Sharlett Iles, MD 07/27/15 1352

## 2015-07-27 NOTE — ED Notes (Signed)
MD at bedside. 

## 2015-07-27 NOTE — ED Notes (Signed)
Pt reports recent surgery and use of pain medications resulting in constipation; last BM 8 days ago; use of enema without BM.

## 2015-07-27 NOTE — ED Notes (Signed)
Large BM  results after enema

## 2015-10-28 DIAGNOSIS — J302 Other seasonal allergic rhinitis: Secondary | ICD-10-CM | POA: Diagnosis not present

## 2015-10-28 DIAGNOSIS — R0981 Nasal congestion: Secondary | ICD-10-CM | POA: Diagnosis not present

## 2015-11-04 ENCOUNTER — Ambulatory Visit (INDEPENDENT_AMBULATORY_CARE_PROVIDER_SITE_OTHER): Payer: 59 | Admitting: Physician Assistant

## 2015-11-04 VITALS — BP 128/78 | HR 69 | Temp 98.5°F | Resp 17 | Ht 66.5 in | Wt 157.0 lb

## 2015-11-04 DIAGNOSIS — R21 Rash and other nonspecific skin eruption: Secondary | ICD-10-CM | POA: Diagnosis not present

## 2015-11-04 DIAGNOSIS — R6889 Other general symptoms and signs: Secondary | ICD-10-CM

## 2015-11-04 LAB — POCT CBC
GRANULOCYTE PERCENT: 63.7 % (ref 37–80)
HEMATOCRIT: 41.5 % — AB (ref 43.5–53.7)
Hemoglobin: 14.7 g/dL (ref 14.1–18.1)
Lymph, poc: 1.3 (ref 0.6–3.4)
MCH, POC: 30 pg (ref 27–31.2)
MCHC: 35.4 g/dL (ref 31.8–35.4)
MCV: 84.7 fL (ref 80–97)
MID (cbc): 0.7 (ref 0–0.9)
MPV: 7 fL (ref 0–99.8)
POC GRANULOCYTE: 3.6 (ref 2–6.9)
POC LYMPH %: 23.2 % (ref 10–50)
POC MID %: 13.1 % — AB (ref 0–12)
Platelet Count, POC: 188 10*3/uL (ref 142–424)
RBC: 4.9 M/uL (ref 4.69–6.13)
RDW, POC: 13.2 %
WBC: 5.6 10*3/uL (ref 4.6–10.2)

## 2015-11-04 MED ORDER — DOXYCYCLINE HYCLATE 100 MG PO CAPS: 100.0000 mg | ORAL_CAPSULE | Freq: Two times a day (BID) | ORAL | Status: AC

## 2015-11-04 MED FILL — DOXYCYCLINE HYCLATE 100 MG: 100 | 10 days supply | Qty: 20 | Fill #0

## 2015-11-04 NOTE — Progress Notes (Signed)
[facility] [address] [place] [address] [phone]  Date:  11/04/2015   Name:  Robert Roman   DOB:  1980/04/29   MRN:  [id number]  PCP:  Lucretia Kern., DO    Chief Complaint: Insect Bite   History of Present Illness:  This is a 35 y.o. male who is presenting with insect bite to right leg. States he woke this morning with flu-like symptoms. Having body aches, some neck pain, chills. No documented fever. Mild headache. No n/v. His wife realized he had a rash on his right calf. Hadn't noticed it before. 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 He denies being outside recently. He did get a new mattress this weekend. Didn't have frame yet, so was sleeping with mattress on the floor. Does have a dog but hasn't noticed any ticks on him. He was on-call this weekend and spent most of the weekend working at the hospital.  Review of Systems:  Review of Systems See HPI  Patient Active Problem List   Diagnosis Date Noted  . Deviated septum 07/19/2015  . Tonsillar hypertrophy 07/19/2015  . Other allergic rhinitis 03/08/2014    Prior to Admission medications   Medication Sig Start Date End Date Taking? Authorizing Provider  fluticasone (FLONASE) 50 MCG/ACT nasal spray Place into both nostrils daily.   Yes Historical Provider, MD  loratadine (CLARITIN) 10 MG tablet Take 10 mg by mouth daily.   Yes Historical Provider, MD    No Known Allergies  Past Surgical History  Procedure Laterality Date  . Wisdom tooth extraction    . Nasal septoplasty w/ turbinoplasty Bilateral 07/19/2015    Procedure: NASAL SEPTOPLASTY WITH BILATERAL INFERIOR TURBINATE REDUCTION;  Surgeon: Jerrell Belfast, MD;  Location: [facility];  Service: ENT;  Laterality: Bilateral;  . Tonsillectomy Bilateral 07/19/2015    Procedure: BILATERAL TONSILLECTOMY;  Surgeon: Jerrell Belfast, MD;   Location: [facility];  Service: ENT;  Laterality: Bilateral;    Social History  Substance Use Topics  . Smoking status: Never Smoker   . Smokeless tobacco: None     Comment: smoked as a teenager  . Alcohol Use: 0.0 oz/week    0 Standard drinks or equivalent per week     Comment: 2-3 drinks a few days per week    Family History  Problem Relation Age of Onset  . Thyroid disease Mother   . Irritable bowel syndrome Mother   . Hypertension Sister   . Heart disease Maternal Grandfather   . Heart disease Paternal Grandfather     Medication list has been reviewed and updated.  Physical Examination:  Physical Exam  Constitutional: He is oriented to person, place, and time. He appears well-developed and well-nourished. No distress.  HENT:  Head: Normocephalic and atraumatic.  Right Ear: Hearing normal.  Left Ear: Hearing normal.  Nose: Nose normal.  Eyes: Conjunctivae and lids are normal. Right eye exhibits no discharge. Left eye exhibits no discharge. No scleral icterus.  Neck: No Brudzinski's sign noted.  Cardiovascular: Normal rate, regular rhythm, normal heart sounds and normal pulses.   No murmur heard. Pulmonary/Chest: Effort normal and breath sounds normal. No respiratory distress. He has no wheezes. He has no rhonchi. He has no rales.  Musculoskeletal: Normal range of motion.  Neurological: He is alert and oriented to person, place, and time.  Skin: Skin is  warm, dry and intact.  Right calf with 3-4 cm circular area of erythema with central clearing. No tenderness.   Psychiatric: He has a normal mood and affect. His speech is normal and behavior is normal. Thought content normal.   BP 128/78 mmHg  Pulse 69  Temp(Src) 98.5 F (36.9 C) (Oral)  Resp 17  Ht 5' 6.5" (1.689 m)  Wt 157 lb (71.215 kg)  BMI 24.96 kg/m2  SpO2 98%  Results for orders placed or performed in visit on 11/04/15  POCT CBC  Result Value Ref Range   WBC 5.6 4.6 - 10.2 K/uL    Lymph, poc 1.3 0.6 - 3.4   POC LYMPH PERCENT 23.2 10 - 50 %L   MID (cbc) 0.7 0 - 0.9   POC MID % 13.1 (A) 0 - 12 %M   POC Granulocyte 3.6 2 - 6.9   Granulocyte percent 63.7 37 - 80 %G   RBC 4.90 4.69 - 6.13 M/uL   Hemoglobin 14.7 14.1 - 18.1 g/dL   HCT, POC 41.5 (A) 43.5 - 53.7 %   MCV 84.7 80 - 97 fL   MCH, POC 30.0 27 - 31.2 pg   MCHC 35.4 31.8 - 35.4 g/dL   RDW, POC 13.2 %   Platelet Count, POC 188 142 - 424 K/uL   MPV 7.0 0 - 99.8 fL   Assessment and Plan:  1. Rash 2. Flu-like symptoms Flu like symptoms and a new rash on calf resembling tick bite. CBC wnl. Lyme and RMSF titers pending. Start empirically on doxy. If titers negative, will have pt stop doxy. Return precautions discussed. - POCT CBC - Lyme Ab/Western Blot Reflex - Rickettsia Ab Pnl w/Rfx to Titer - doxycycline (VIBRAMYCIN) 100 MG capsule; Take 1 capsule (100 mg total) by mouth 2 (two) times daily. AVOID EXCESS SUN EXPOSURE WHILE ON THIS MEDICATION  Dispense: 20 capsule; Refill: 0 - doxycycline (VIBRAMYCIN) 100 MG capsule; Take 1 capsule (100 mg total) by mouth 2 (two) times daily. AVOID EXCESS SUN EXPOSURE WHILE ON THIS MEDICATION  Dispense: 20 capsule; Refill: 0   Benjaman Pott. Drenda Freeze, MHS [facility]  11/04/2015

## 2015-11-04 NOTE — Patient Instructions (Signed)
Doxy twice a day for 10 days. Avoid excess sun exposure. I will call you with results of your lyme and rmsf titers, if negative you can stop the doxy. Monitor your rash -- return if you develop fever, worsening flu symptoms, worsening rash

## 2015-11-05 LAB — LYME AB/WESTERN BLOT REFLEX: B burgdorferi Ab IgG+IgM: <0.9 Index (ref <0.90)

## 2015-11-06 LAB — RICKETTSIA AB PNL W/RFX TO TITER
R. typhi IgG: NOT DETECTED
R. typhi IgM: NOT DETECTED
RMSF IgG: NOT DETECTED
RMSF IgM: NOT DETECTED

## 2015-12-11 DIAGNOSIS — D224 Melanocytic nevi of scalp and neck: Secondary | ICD-10-CM | POA: Diagnosis not present

## 2015-12-11 DIAGNOSIS — D225 Melanocytic nevi of trunk: Secondary | ICD-10-CM | POA: Diagnosis not present

## 2015-12-11 DIAGNOSIS — D235 Other benign neoplasm of skin of trunk: Secondary | ICD-10-CM | POA: Diagnosis not present

## 2015-12-11 DIAGNOSIS — L858 Other specified epidermal thickening: Secondary | ICD-10-CM | POA: Diagnosis not present

## 2015-12-11 DIAGNOSIS — L218 Other seborrheic dermatitis: Secondary | ICD-10-CM | POA: Diagnosis not present

## 2016-02-03 DIAGNOSIS — H1045 Other chronic allergic conjunctivitis: Secondary | ICD-10-CM | POA: Diagnosis not present

## 2016-02-03 DIAGNOSIS — H40013 Open angle with borderline findings, low risk, bilateral: Secondary | ICD-10-CM | POA: Diagnosis not present

## 2016-02-03 DIAGNOSIS — H10413 Chronic giant papillary conjunctivitis, bilateral: Secondary | ICD-10-CM | POA: Diagnosis not present

## 2016-08-18 NOTE — Progress Notes (Signed)
HPI:  Here for CPE: Due for labs. Thinks had his tetnaus booster. Had some mild chest discomfort R sided at rest about 1 month ago that last about 10 minutes. No other symptoms and no symptoms since. No SOB, DOE, CP on exertion, GERD or any other concerns.  -Concerns and/or follow up today: none  -Diet: feels is healthy  -Exercise: some regular exercise  -Diabetes and Dyslipidemia Screening: FASTING for labs  -Hx of HTN: no  -Vaccines: UTD he feels - he thinks had tetanus at work  -sexual activity: yes, male partner, no new partners  -wants STI testing, Hep C screening (if born 15-1966): no  -FH colon or prstate ca: see FH Last colon cancer screening: n/a Last prostate ca screening: n/a  -Alcohol, Tobacco, drug use: see social history  Review of Systems - no fevers, unintentional weight loss, vision loss, hearing loss, chest pain, sob, hemoptysis, melena, hematochezia, hematuria, genital discharge, changing or concerning skin lesions, bleeding, bruising, loc, thoughts of self harm or SI  Past Medical History:  Diagnosis Date  . Allergy     Past Surgical History:  Procedure Laterality Date  . NASAL SEPTOPLASTY W/ TURBINOPLASTY Bilateral 07/19/2015   Procedure: NASAL SEPTOPLASTY WITH BILATERAL INFERIOR TURBINATE REDUCTION;  Surgeon: Jerrell Belfast, MD;  Location: [place];  Service: ENT;  Laterality: Bilateral;  . TONSILLECTOMY Bilateral 07/19/2015   Procedure: BILATERAL TONSILLECTOMY;  Surgeon: Jerrell Belfast, MD;  Location: [place];  Service: ENT;  Laterality: Bilateral;  . WISDOM TOOTH EXTRACTION      Family History  Problem Relation Age of Onset  . Thyroid disease Mother   . Irritable bowel syndrome Mother   . Hypertension Sister   . Heart disease Maternal Grandfather   . Heart disease Paternal Grandfather     Social History   Social History  . Marital status: Married    Spouse name: N/A  . Number of children: N/A  .  Years of education: N/A   Social History Main Topics  . Smoking status: Never Smoker  . Smokeless tobacco: Never Used     Comment: smoked as a teenager  . Alcohol use 0.0 oz/week     Comment: 2-3 drinks a few days per week  . Drug use: No  . Sexual activity: Not Asked   Other Topics Concern  . 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  fluticasone (FLONASE) 50 MCG/ACT nasal spray, Place into both nostrils daily., Disp: , Rfl:  .  loratadine (CLARITIN) 10 MG tablet, Take 10 mg by mouth daily., Disp: , Rfl:   EXAM:  Vitals:   08/20/16 0926  BP: (!) 80/60  Pulse: 61  Temp: 98.3 F (36.8 C)  TempSrc: Oral  Weight: 161 lb 6.4 oz (73.2 kg)  Height: 5\' 6"  (1.676 m)    Estimated body mass index is 26.05 kg/m as calculated from the following:   Height as of this encounter: 5\' 6"  (1.676 m).   Weight as of this encounter: 161 lb 6.4 oz (73.2 kg).  GENERAL: vitals reviewed and listed below, alert, oriented, appears well hydrated and in no acute distress  HEENT: head atraumatic, PERRLA, normal appearance of eyes, ears, nose and mouth. moist mucus membranes.  NECK: supple, no masses or lymphadenopathy  LUNGS: clear to auscultation bilaterally, no rales, rhonchi or wheeze  CV: HRRR, no peripheral edema or cyanosis, normal pedal pulses  ABDOMEN: bowel sounds normal, soft, non tender to palpation, no masses, no rebound or guarding  GU/DRE: declined  SKIN: no rash or abnormal lesions - declined full skin exam - reports sees dermatologist  MS: normal gait, moves all extremities normally  NEURO: normal gait, speech and thought processing grossly intact, muscle tone grossly intact throughout  PSYCH: normal  affect, pleasant and cooperative  ASSESSMENT AND PLAN:  Discussed the following assessment and plan:  Encounter for preventive health examination - Plan: Lipid panel, Hemoglobin A1c  -Discussed and advised all [place] preventive services health task force level A and B recommendations for age, sex and risks.  --Advised at least 150 minutes of exercise per week and a healthy diet with avoidance of (less then 1 serving per week) processed foods, white starches, red meat, fast foods and sweets and consisting of: * 5-9 servings of fresh fruits and vegetables (not corn or potatoes) *nuts and seeds, beans *olives and olive oil *lean meats such as fish and white chicken  *whole grains  -FASTING labs, studies and vaccines per orders this encounter   Patient advised to return to clinic immediately if symptoms worsen or persist or new concerns.  Patient Instructions  BEFORE YOU LEAVE: -follow up: yearly for physical -labs  We have ordered labs or studies at this visit. It can take up to 1-2 weeks for results and processing. IF results require follow up or explanation, we will call you with instructions. Clinically stable results will be released to your [facility]. If you have not heard from [place] or cannot find your results in [facility] in 2 weeks please contact our office at [phone].  If you are not yet signed up for [facility], please consider signing up.  Advise regular aerobic exercise (at least 150 minutes per week of sweaty exercise) and a healthy diet. Try to eat at least 5-9 servings of vegetables and fruits per day (not corn, potatoes or bananas.) Avoid sweets, red meat, pork, butter, fried foods, fast food, processed food, excessive dairy, eggs and coconut. Replace bad fats with good fats - fish, nuts and seeds, canola oil, olive oil.   WE NOW OFFER   [facility] FAST TRACK!!!  SAME DAY Appointments for ACUTE CARE  Such as: Sprains, Injuries, cuts, abrasions, rashes, muscle  pain, joint pain, back pain Colds, flu, sore throats, headache, allergies, cough, fever  Ear pain, sinus and eye infections Abdominal pain, nausea, vomiting, diarrhea, upset stomach Animal/insect bites  3 Easy Ways to Schedule: Walk-In Scheduling Call in scheduling Mychart Sign-up: [email or web address]                No Follow-up on file.   Colin Benton R., DO

## 2016-08-20 ENCOUNTER — Encounter: Payer: Self-pay | Admitting: Family Medicine

## 2016-08-20 ENCOUNTER — Ambulatory Visit (INDEPENDENT_AMBULATORY_CARE_PROVIDER_SITE_OTHER): Payer: 59 | Admitting: Family Medicine

## 2016-08-20 VITALS — BP 80/60 | HR 61 | Temp 98.3°F | Ht 66.0 in | Wt 161.4 lb

## 2016-08-20 DIAGNOSIS — Z0000 Encounter for general adult medical examination without abnormal findings: Secondary | ICD-10-CM | POA: Diagnosis not present

## 2016-08-20 DIAGNOSIS — E785 Hyperlipidemia, unspecified: Secondary | ICD-10-CM

## 2016-08-20 LAB — LIPID PANEL
Cholesterol: 208 mg/dL — ABNORMAL HIGH (ref 0–200)
HDL: 35.3 mg/dL — ABNORMAL LOW (ref >39.00)
NonHDL: 172.89
Total CHOL/HDL Ratio: 6
Triglycerides: 232 mg/dL — ABNORMAL HIGH (ref 0.0–149.0)
VLDL: 46.4 mg/dL — ABNORMAL HIGH (ref 0.0–40.0)

## 2016-08-20 LAB — LDL CHOLESTEROL, DIRECT: LDL DIRECT: 122 mg/dL

## 2016-08-20 LAB — HEMOGLOBIN A1C: HEMOGLOBIN A1C: 5.3 % (ref 4.6–6.5)

## 2016-08-20 NOTE — Progress Notes (Signed)
Pre visit review using our clinic review tool, if applicable. No additional management support is needed unless otherwise documented below in the visit note. 

## 2016-08-20 NOTE — Patient Instructions (Signed)
BEFORE YOU LEAVE: -follow up: yearly for physical -labs  We have ordered labs or studies at this visit. It can take up to 1-2 weeks for results and processing. IF results require follow up or explanation, we will call you with instructions. Clinically stable results will be released to your [facility]. If you have not heard from [place] or cannot find your results in [facility] in 2 weeks please contact our office at [phone].  If you are not yet signed up for [facility], please consider signing up.  Advise regular aerobic exercise (at least 150 minutes per week of sweaty exercise) and a healthy diet. Try to eat at least 5-9 servings of vegetables and fruits per day (not corn, potatoes or bananas.) Avoid sweets, red meat, pork, butter, fried foods, fast food, processed food, excessive dairy, eggs and coconut. Replace bad fats with good fats - fish, nuts and seeds, canola oil, olive oil.   WE NOW OFFER   [facility] FAST TRACK!!!  SAME DAY Appointments for ACUTE CARE  Such as: Sprains, Injuries, cuts, abrasions, rashes, muscle pain, joint pain, back pain Colds, flu, sore throats, headache, allergies, cough, fever  Ear pain, sinus and eye infections Abdominal pain, nausea, vomiting, diarrhea, upset stomach Animal/insect bites  3 Easy Ways to Schedule: Walk-In Scheduling Call in scheduling Mychart Sign-up: [email or web address]

## 2016-08-21 NOTE — Addendum Note (Signed)
Addended by: Agnes Lawrence on: 08/21/2016 02:47 PM   Modules accepted: Orders

## 2016-11-19 DIAGNOSIS — H40013 Open angle with borderline findings, low risk, bilateral: Secondary | ICD-10-CM | POA: Diagnosis not present

## 2017-01-07 ENCOUNTER — Encounter: Payer: Self-pay | Admitting: Family Medicine

## 2017-01-24 IMAGING — DX DG ABDOMEN 1V
1 series · 1 of 1 positions shown · non-contrast
Comparison: None.

CLINICAL DATA: Constipation.

EXAM:
ABDOMEN - 1 VIEW

[abdomen kub]
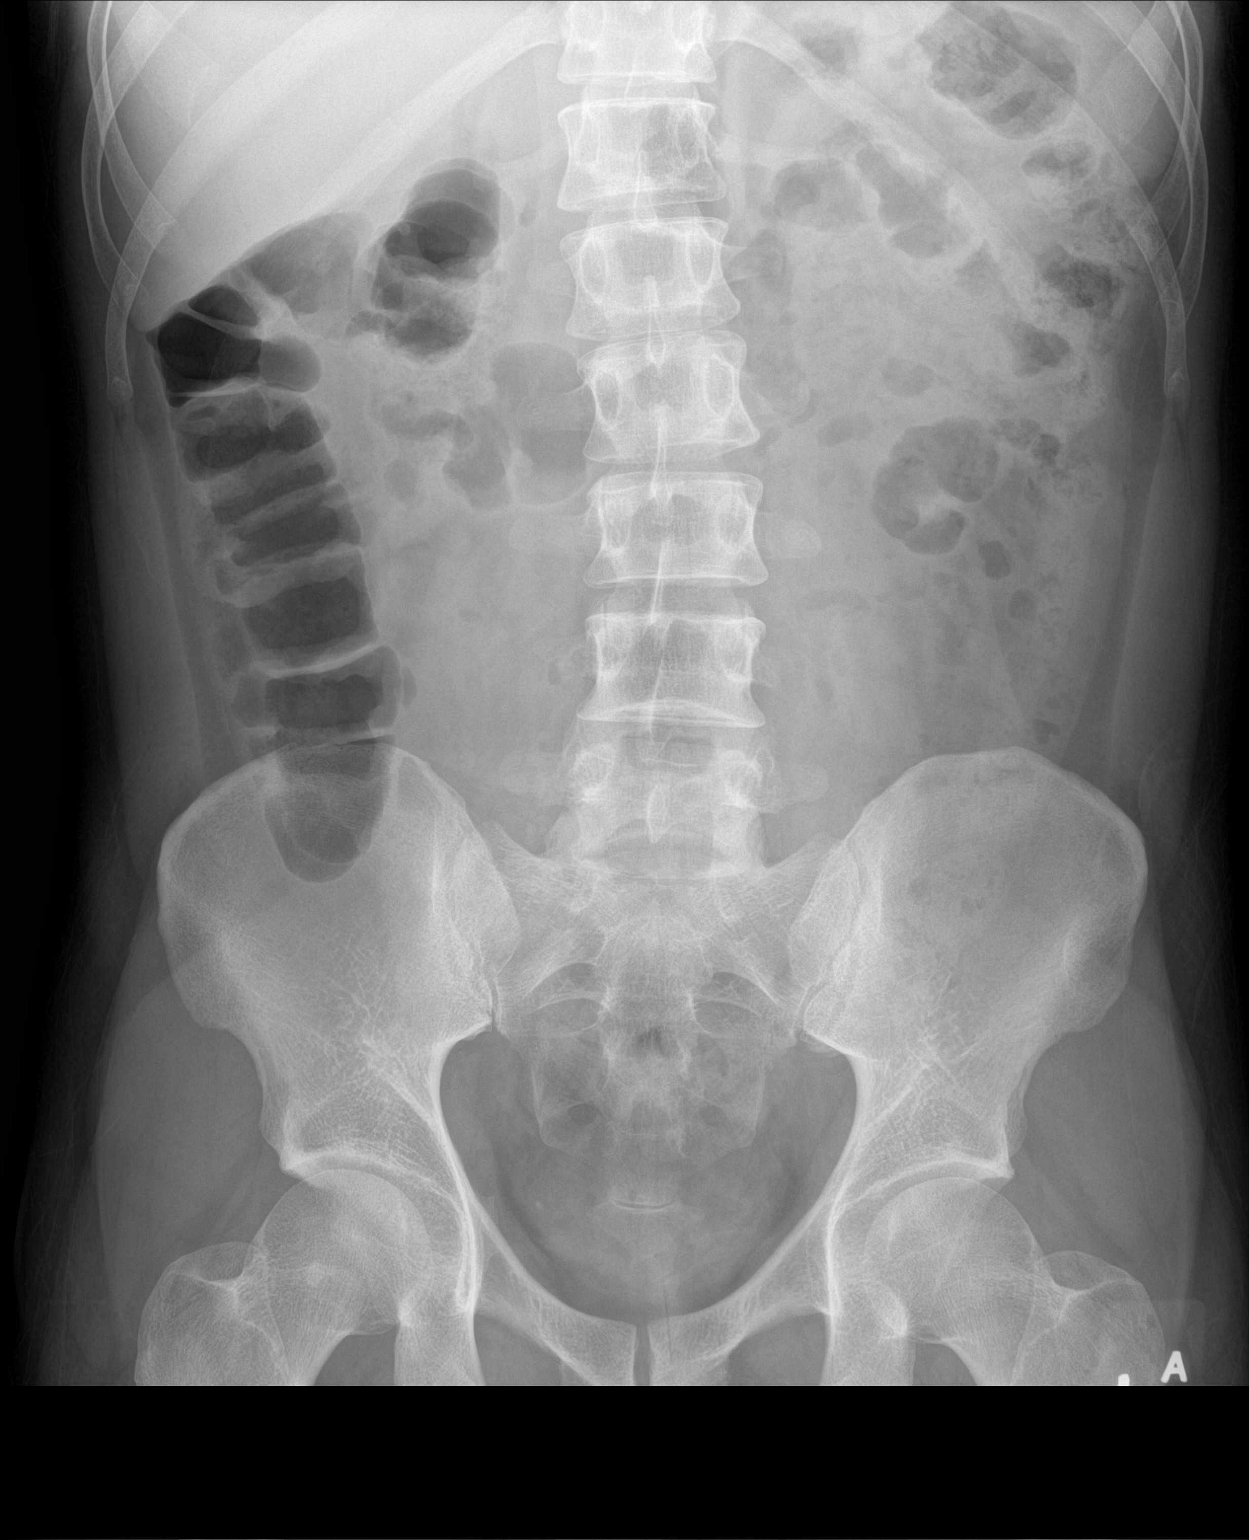

[1 of 1 positions shown; findings below may reference images not displayed]

FINDINGS: Mild fecal loading throughout the left colon. No bowel obstruction
or ileus. No other acute abnormalities.
IMPRESSION: Mild fecal loading throughout the left colon.

## 2017-02-05 DIAGNOSIS — L812 Freckles: Secondary | ICD-10-CM | POA: Diagnosis not present

## 2017-02-05 DIAGNOSIS — L821 Other seborrheic keratosis: Secondary | ICD-10-CM | POA: Diagnosis not present

## 2017-02-05 DIAGNOSIS — D225 Melanocytic nevi of trunk: Secondary | ICD-10-CM | POA: Diagnosis not present

## 2017-02-05 DIAGNOSIS — L218 Other seborrheic dermatitis: Secondary | ICD-10-CM | POA: Diagnosis not present

## 2017-02-05 DIAGNOSIS — L858 Other specified epidermal thickening: Secondary | ICD-10-CM | POA: Diagnosis not present

## 2017-02-05 DIAGNOSIS — D2362 Other benign neoplasm of skin of left upper limb, including shoulder: Secondary | ICD-10-CM | POA: Diagnosis not present

## 2017-04-20 HISTORY — PX: VASECTOMY: SHX75

## 2017-04-29 ENCOUNTER — Encounter: Payer: Self-pay | Admitting: Family Medicine

## 2017-06-21 ENCOUNTER — Ambulatory Visit: Payer: 59 | Admitting: Family Medicine

## 2017-06-22 ENCOUNTER — Encounter: Payer: Self-pay | Admitting: Family Medicine

## 2017-06-22 ENCOUNTER — Ambulatory Visit (INDEPENDENT_AMBULATORY_CARE_PROVIDER_SITE_OTHER): Payer: No Typology Code available for payment source | Admitting: Family Medicine

## 2017-06-22 VITALS — BP 108/62 | HR 75 | Temp 98.3°F | Ht 66.0 in | Wt 162.6 lb

## 2017-06-22 DIAGNOSIS — M79644 Pain in right finger(s): Secondary | ICD-10-CM | POA: Diagnosis not present

## 2017-06-22 NOTE — Progress Notes (Signed)
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 Amedeo Plenty at [facility] about this Reports she had pain in several fingers in the past and had an extensive evaluation with MRI and inflammatory labs with Dr. Estanislado Pandy.  Reports all this turned out to be okay. -no fevers, malaise, recent illness, issues with other joints currently or known injury  ROS: See pertinent positives and negatives per HPI.  Past Medical History:  Diagnosis Date  . Allergy     Past Surgical History:  Procedure Laterality Date  . NASAL SEPTOPLASTY W/ TURBINOPLASTY Bilateral 07/19/2015   Procedure: NASAL SEPTOPLASTY WITH BILATERAL INFERIOR TURBINATE REDUCTION;  Surgeon: Jerrell Belfast, MD;  Location: [place];  Service: ENT;  Laterality: Bilateral;  . TONSILLECTOMY Bilateral 07/19/2015   Procedure: BILATERAL TONSILLECTOMY;  Surgeon: Jerrell Belfast, MD;  Location: [place];  Service: ENT;  Laterality: Bilateral;  . WISDOM TOOTH EXTRACTION      Family History  Problem Relation Age of Onset  . Thyroid disease Mother   . Irritable bowel syndrome Mother   . Hypertension Sister   . Heart disease Maternal Grandfather   . Heart disease Paternal Grandfather     Social History   Socioeconomic History  . Marital status: Married    Spouse name: None  . Number of children: None  . Years of education: None  . Highest education level: None  Social Needs  . Financial resource strain: None  . Food insecurity - worry: None  . Food insecurity - inability: None  . Transportation needs - medical: None  . Transportation needs - non-medical: None  Occupational  History  . None  Tobacco Use  . Smoking status: Never Smoker  . Smokeless tobacco: Never Used  . Tobacco comment: smoked as a teenager  Substance and Sexual Activity  . Alcohol use: Yes    Alcohol/week: 0.0 oz    Comment: 2-3 drinks a few days per week  . Drug use: No  . Sexual activity: None  Other Topics Concern  . 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  fluticasone (FLONASE) 50 MCG/ACT nasal spray, Place into both nostrils daily., Disp: , Rfl:  .  loratadine (CLARITIN) 10 MG tablet, Take 10 mg by mouth daily., Disp: , Rfl:   EXAM:  Vitals:   06/22/17 1325  BP: 108/62  Pulse: 75  Temp: 98.3 F (36.8 C)    Body mass index is 26.24 kg/m.  GENERAL: vitals reviewed and listed above, alert, oriented, appears well hydrated and in no acute distress  MS: moves all extremities without noticeable abnormality, normal inspection of both hands without redness, swelling, warmth or deformity.  He has negative snuffbox tenderness.  He has some tenderness to palpation at the MCP joint of the first digit on the right hand.  No locking or catching.  Strength throughout is preserved.  PSYCH: pleasant and cooperative, no obvious depression or anxiety  ASSESSMENT AND PLAN:  Discussed the following assessment and plan:  Pain of right thumb - Plan: Ambulatory referral to Orthopedic Surgery  -we discussed possible serious and likely etiologies, workup and treatment, treatment risks and return precautions -suspect osteoarthritis or strain/sprain and advised ice, as needed analgesics, temporary bracing if needed, close follow-up -after this discussion, Yandel prefers to see an orthopedic specialist about this and requests a referral  to [facility] orthopedics to see Dr. Amedeo Plenty.  Is concerned given he uses his hands a lot for his work.  Referral was placed. -follow up advised here as needed  Patient Instructions  Please see if you once his flu shot?  Or, did he have it at work? Please update health maintenance if so.  I placed the orthopedic referral.  In the interim, using the brace, ice, over-the-counter analgesics per instructions as needed and topical sports creams may be helpful.  I hope you feel better soon!   Lucretia Kern, DO

## 2017-06-22 NOTE — Patient Instructions (Addendum)
Please see if you once his flu shot?  Or, did he have it at work? Please update health maintenance if so.  I placed the orthopedic referral.  In the interim, using the brace, ice, over-the-counter analgesics per instructions as needed and topical sports creams may be helpful.  I hope you feel better soon!

## 2017-07-12 DIAGNOSIS — M79641 Pain in right hand: Secondary | ICD-10-CM | POA: Insufficient documentation

## 2017-07-12 DIAGNOSIS — M6530 Trigger finger, unspecified finger: Secondary | ICD-10-CM | POA: Insufficient documentation

## 2017-09-14 ENCOUNTER — Encounter: Payer: Self-pay | Admitting: Family Medicine

## 2017-09-14 DIAGNOSIS — Z3009 Encounter for other general counseling and advice on contraception: Secondary | ICD-10-CM

## 2017-11-11 NOTE — Progress Notes (Signed)
HPI:  Using dictation device. Unfortunately this device frequently misinterprets words/phrases.  Here for CPE:  -Concerns and/or follow up today: none Taking ginger and tumeric supplements.  -Diet: variety of foods, balance  -Exercise:  regular exercise -Diabetes and Dyslipidemia Screening: fasting for labs and also requests a BMP -Hx of HTN: no -Vaccines: UTD Per his report, works for report insistent this should be up-to-date -wants STI testing, Hep C screening (if born 88-1966): no -FH colon or prstate ca: see FH Last colon cancer screening: n/a Last prostate ca screening: Not applicable -Alcohol, Tobacco, drug use: see social history  Review of Systems - no fevers, unintentional weight loss, vision loss, hearing loss, chest pain, sob, hemoptysis, melena, hematochezia, hematuria, genital discharge, changing or concerning skin lesions, bleeding, bruising, loc, thoughts of self harm or SI  Past Medical History:  Diagnosis Date  . Allergy     Past Surgical History:  Procedure Laterality Date  . NASAL SEPTOPLASTY W/ TURBINOPLASTY Bilateral 07/19/2015   Procedure: NASAL SEPTOPLASTY WITH BILATERAL INFERIOR TURBINATE REDUCTION;  Surgeon: Jerrell Belfast, MD;  Location: [place];  Service: ENT;  Laterality: Bilateral;  . TONSILLECTOMY Bilateral 07/19/2015   Procedure: BILATERAL TONSILLECTOMY;  Surgeon: Jerrell Belfast, MD;  Location: [place];  Service: ENT;  Laterality: Bilateral;  . WISDOM TOOTH EXTRACTION      Family History  Problem Relation Age of Onset  . Thyroid disease Mother   . Irritable bowel syndrome Mother   . Hypertension Sister   . Heart disease Maternal Grandfather   . Heart disease Paternal Grandfather     Social History   Socioeconomic History  . Marital status: Married    Spouse name: Not on file  . Number of children: Not on file  . Years of education: Not on file  . Highest education level: Not on file    Occupational History  . Not on file  Social Needs  . Financial resource strain: Not on file  . Food insecurity:    Worry: Not on file    Inability: Not on file  . Transportation needs:    Medical: Not on file    Non-medical: Not on file  Tobacco Use  . Smoking status: Never Smoker  . Smokeless tobacco: Never Used  . Tobacco comment: smoked as a teenager  Substance and Sexual Activity  . Alcohol use: Yes    Alcohol/week: 0.0 oz    Comment: 2-3 drinks a few days per week  . Drug use: No  . Sexual activity: Not on file  Lifestyle  . Physical activity:    Days per week: Not on file    Minutes per session: Not on file  . Stress: Not on file  Relationships  . Social connections:    Talks on phone: Not on file    Gets together: Not on file    Attends religious service: Not on file    Active member of club or organization: Not on file    Attends meetings of clubs or organizations: Not on file    Relationship status: Not on file  Other Topics Concern  . 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  fluticasone (FLONASE) 50 MCG/ACT nasal spray, Place into both nostrils daily as needed. , Disp: , Rfl:  .  loratadine (CLARITIN) 10 MG tablet, Take 10 mg by mouth daily as needed. , Disp: , Rfl:   EXAM:  Vitals:   11/15/17 0802  BP: 98/70  Pulse: 63  Temp: 97.8 F (36.6 C)  TempSrc: Oral  Weight: 159 lb 3.2 oz (72.2 kg)  Height: 5' 8" (1.727 m)    Estimated body mass index is 24.21 kg/m as calculated from the following:   Height as of this encounter: 5' 8" (1.727 m).   Weight as of this encounter: 159 lb 3.2 oz (72.2 kg).  GENERAL: vitals reviewed and listed below, alert, oriented, appears well hydrated  and in no acute distress  HEENT: head atraumatic, PERRLA, normal appearance of eyes, ears, nose and mouth. moist mucus membranes.  NECK: supple, no masses or lymphadenopathy  LUNGS: clear to auscultation bilaterally, no rales, rhonchi or wheeze  CV: HRRR, no peripheral edema or cyanosis, normal pedal pulses  ABDOMEN: bowel sounds normal, soft, non tender to palpation, no masses, no rebound or guarding  GU: deferred  SKIN: no rash or abnormal lesions  MS: normal gait, moves all extremities normally  NEURO: normal gait, speech and thought processing grossly intact, muscle tone grossly intact throughout  PSYCH: normal affect, pleasant and cooperative  ASSESSMENT AND PLAN:  Discussed the following assessment and plan:  PREVENTIVE EXAM: -Discussed and advised all [place] preventive services health task force level A and B recommendations for age, sex and risks. -Advised at least 150 minutes of exercise per week and a healthy diet with avoidance of (less then 1 serving per week) processed foods, white starches, red meat, fast foods and sweets and consisting of: * 5-9 servings of fresh fruits and vegetables (not corn or potatoes) *nuts and seeds, beans *olives and olive oil *lean meats such as fish and white chicken  *whole grains -labs, studies and vaccines per orders this encounter    Patient Instructions  BEFORE YOU LEAVE: -labs -follow up: yearly and as needed  We have ordered labs or studies at this visit. It can take up to 1-2 weeks for results and processing. IF results require follow up or explanation, we will call you with instructions. Clinically stable results will be released to your [facility]. If you have not heard from [place] or cannot find your results in [facility] in 2 weeks please contact our office at [phone].  If you are not yet signed up for [facility], please consider signing up.   Preventive Care 18-39 Years, Male Preventive care refers to lifestyle choices and  visits with your health care provider that can promote health and wellness. What does preventive care include?  A yearly physical exam. This is also called an annual well check.  Dental exams once or twice a year.  Routine eye exams. Ask your health care provider how often you should have your eyes checked.  Personal lifestyle choices, including: ? Daily care of your teeth and gums. ? Regular physical activity. ? Eating a healthy diet. ? Avoiding tobacco and drug use. ? Limiting alcohol use. ? Practicing safe sex. What happens during an annual well check? The services and screenings done by your health care provider during your annual well check will depend on your age, overall health, lifestyle risk factors, and family history of disease. Counseling Your health care provider may ask you questions about your:  Alcohol use.  Tobacco use.  Drug use.  Emotional well-being.  Home and relationship well-being.  Sexual activity.  Eating habits.  Work and work Statistician.  Screening You may have the following tests or measurements:  Height, weight, and BMI.  Blood pressure.  Lipid and cholesterol levels. These may be checked every 5 years starting at age 82.  Diabetes screening. This is done by checking your blood sugar (glucose) after you have not eaten for a while (fasting).  Skin check.  Hepatitis C blood test.  Hepatitis B blood test.  Sexually transmitted disease (STD) testing.  Discuss your test results, treatment options, and if necessary, the need for more tests with your health care provider. Vaccines Your health care provider may recommend certain vaccines, such as:  Influenza vaccine. This is recommended every year.  Tetanus, diphtheria, and acellular pertussis (Tdap, Td) vaccine. You may need a Td booster every 10 years.  Varicella vaccine. You may need this if you have not been vaccinated.  HPV vaccine. If you are 66 or younger, you may need  three doses over 6 months.  Measles, mumps, and rubella (MMR) vaccine. You may need at least one dose of MMR.You may also need a second dose.  Pneumococcal 13-valent conjugate (PCV13) vaccine. You may need this if you have certain conditions and have not been vaccinated.  Pneumococcal polysaccharide (PPSV23) vaccine. You may need one or two doses if you smoke cigarettes or if you have certain conditions.  Meningococcal vaccine. One dose is recommended if you are age 74-21 years and a first-year college student living in a residence hall, or if you have one of several medical conditions. You may also need additional booster doses.  Hepatitis A vaccine. You may need this if you have certain conditions or if you travel or work in places where you may be exposed to hepatitis A.  Hepatitis B vaccine. You may need this if you have certain conditions or if you travel or work in places where you may be exposed to hepatitis B.  Haemophilus influenzae type b (Hib) vaccine. You may need this if you have certain risk factors.  Talk to your health care provider about which screenings and vaccines you need and how often you need them. This information is not intended to replace advice given to you by your health care provider. Make sure you discuss any questions you have with your health care provider. Document Released: 06/02/2001 Document Revised: 12/25/2015 Document Reviewed: 02/05/2015 Elsevier Interactive Patient Education  2018 [employer].                 No follow-ups on file.   Lucretia Kern, DO

## 2017-11-15 ENCOUNTER — Encounter: Payer: Self-pay | Admitting: Family Medicine

## 2017-11-15 ENCOUNTER — Ambulatory Visit (INDEPENDENT_AMBULATORY_CARE_PROVIDER_SITE_OTHER): Payer: No Typology Code available for payment source | Admitting: Family Medicine

## 2017-11-15 VITALS — BP 98/70 | HR 63 | Temp 97.8°F | Ht 68.0 in | Wt 159.2 lb

## 2017-11-15 DIAGNOSIS — Z1331 Encounter for screening for depression: Secondary | ICD-10-CM | POA: Diagnosis not present

## 2017-11-15 DIAGNOSIS — Z0000 Encounter for general adult medical examination without abnormal findings: Secondary | ICD-10-CM

## 2017-11-15 LAB — BASIC METABOLIC PANEL
BUN: 13 mg/dL (ref 6–23)
CHLORIDE: 105 meq/L (ref 96–112)
CO2: 27 meq/L (ref 19–32)
Calcium: 9.4 mg/dL (ref 8.4–10.5)
Creatinine, Ser: 1.05 mg/dL (ref 0.40–1.50)
GFR: 84.11 mL/min (ref >60.00)
Glucose, Bld: 100 mg/dL — ABNORMAL HIGH (ref 70–99)
POTASSIUM: 4.3 meq/L (ref 3.5–5.1)
Sodium: 140 mEq/L (ref 135–145)

## 2017-11-15 LAB — LIPID PANEL
CHOL/HDL RATIO: 5
CHOLESTEROL: 177 mg/dL (ref 0–200)
HDL: 34.3 mg/dL — AB (ref >39.00)
NonHDL: 142.38
Triglycerides: 211 mg/dL — ABNORMAL HIGH (ref 0.0–149.0)
VLDL: 42.2 mg/dL — AB (ref 0.0–40.0)

## 2017-11-15 LAB — HEMOGLOBIN A1C: HEMOGLOBIN A1C: 5.2 % (ref 4.6–6.5)

## 2017-11-15 LAB — LDL CHOLESTEROL, DIRECT: Direct LDL: 112 mg/dL

## 2017-11-15 NOTE — Patient Instructions (Addendum)
BEFORE YOU LEAVE: -labs -follow up: yearly and as needed  We have ordered labs or studies at this visit. It can take up to 1-2 weeks for results and processing. IF results require follow up or explanation, we will call you with instructions. Clinically stable results will be released to your [facility]. If you have not heard from [place] or cannot find your results in [facility] in 2 weeks please contact our office at [phone].  If you are not yet signed up for [facility], please consider signing up.   Preventive Care 18-38 Years, Male Preventive care refers to lifestyle choices and visits with your health care provider that can promote health and wellness. What does preventive care include?  A yearly physical exam. This is also called an annual well check.  Dental exams once or twice a year.  Routine eye exams. Ask your health care provider how often you should have your eyes checked.  Personal lifestyle choices, including: ? Daily care of your teeth and gums. ? Regular physical activity. ? Eating a healthy diet. ? Avoiding tobacco and drug use. ? Limiting alcohol use. ? Practicing safe sex. What happens during an annual well check? The services and screenings done by your health care provider during your annual well check will depend on your age, overall health, lifestyle risk factors, and family history of disease. Counseling Your health care provider may ask you questions about your:  Alcohol use.  Tobacco use.  Drug use.  Emotional well-being.  Home and relationship well-being.  Sexual activity.  Eating habits.  Work and work Statistician.  Screening You may have the following tests or measurements:  Height, weight, and BMI.  Blood pressure.  Lipid and cholesterol levels. These may be checked every 5 years starting at age 29.  Diabetes screening. This is done by checking your blood sugar (glucose) after you have not eaten for a while (fasting).  Skin  check.  Hepatitis C blood test.  Hepatitis B blood test.  Sexually transmitted disease (STD) testing.  Discuss your test results, treatment options, and if necessary, the need for more tests with your health care provider. Vaccines Your health care provider may recommend certain vaccines, such as:  Influenza vaccine. This is recommended every year.  Tetanus, diphtheria, and acellular pertussis (Tdap, Td) vaccine. You may need a Td booster every 10 years.  Varicella vaccine. You may need this if you have not been vaccinated.  HPV vaccine. If you are 60 or younger, you may need three doses over 6 months.  Measles, mumps, and rubella (MMR) vaccine. You may need at least one dose of MMR.You may also need a second dose.  Pneumococcal 13-valent conjugate (PCV13) vaccine. You may need this if you have certain conditions and have not been vaccinated.  Pneumococcal polysaccharide (PPSV23) vaccine. You may need one or two doses if you smoke cigarettes or if you have certain conditions.  Meningococcal vaccine. One dose is recommended if you are age 36-21 years and a first-year college student living in a residence hall, or if you have one of several medical conditions. You may also need additional booster doses.  Hepatitis A vaccine. You may need this if you have certain conditions or if you travel or work in places where you may be exposed to hepatitis A.  Hepatitis B vaccine. You may need this if you have certain conditions or if you travel or work in places where you may be exposed to hepatitis B.  Haemophilus influenzae type b (Hib)  vaccine. You may need this if you have certain risk factors.  Talk to your health care provider about which screenings and vaccines you need and how often you need them. This information is not intended to replace advice given to you by your health care provider. Make sure you discuss any questions you have with your health care provider. Document Released:  06/02/2001 Document Revised: 12/25/2015 Document Reviewed: 02/05/2015 Elsevier Interactive Patient Education  [employer].

## 2018-01-17 MED FILL — diazePAM 10 MG TABS: 10 | 1 days supply | Qty: 2 | Fill #0

## 2018-01-21 MED FILL — HYDROCODON-APAP 5-325: 5-325 | 1 days supply | Qty: 10 | Fill #0

## 2018-05-03 ENCOUNTER — Other Ambulatory Visit: Payer: Self-pay

## 2018-05-03 ENCOUNTER — Ambulatory Visit (HOSPITAL_COMMUNITY)
Admission: EM | Admit: 2018-05-03 | Discharge: 2018-05-03 | Disposition: A | Discharge status: Home / Self Care | Payer: No Typology Code available for payment source | Attending: Family Medicine | Admitting: Family Medicine

## 2018-05-03 ENCOUNTER — Ambulatory Visit (INDEPENDENT_AMBULATORY_CARE_PROVIDER_SITE_OTHER): Payer: No Typology Code available for payment source

## 2018-05-03 ENCOUNTER — Encounter (HOSPITAL_COMMUNITY): Payer: Self-pay | Admitting: Emergency Medicine

## 2018-05-03 DIAGNOSIS — R140 Abdominal distension (gaseous): Secondary | ICD-10-CM | POA: Insufficient documentation

## 2018-05-03 DIAGNOSIS — R0781 Pleurodynia: Secondary | ICD-10-CM

## 2018-05-03 NOTE — ED Triage Notes (Signed)
Patient reports getting "kneed" in left ribs on Saturday.  Denies any bruising.

## 2018-05-04 ENCOUNTER — Ambulatory Visit: Payer: No Typology Code available for payment source | Admitting: Family Medicine

## 2018-05-04 NOTE — ED Provider Notes (Signed)
[place]   [id number] 05/03/18 Arrival Time: 6010  ASSESSMENT & PLAN:  1. Rib pain on left side   2. Abdominal bloating    I have personally viewed the imaging studies ordered this visit. No sign of pneumothorax or rib fracture.  Imaging: Dg Ribs Unilateral W/chest Left  Result Date: 05/03/2018 CLINICAL DATA:  Blunt trauma to left ribs 3 days ago with persistent pain, initial encounter EXAM: LEFT RIBS AND CHEST - 3+ VIEW COMPARISON:  None. FINDINGS: Cardiac shadow is within normal limits. The lungs are well aerated bilaterally. No pneumothorax is seen. Ribs are well visualize without acute fracture. IMPRESSION: No evidence of acute rib fracture. Electronically Signed   By: Inez Catalina M.D.   On: 05/03/2018 18:32   Prefers OTC analgesics if needed.  Difficult to associate abdominal bloating with rib injury 3 days ago. Benign abdominal exam. Watch closely. No indication for CT of abdomen at this time. Discussed.  Follow-up Information    [place].   Specialty:  Emergency Medicine Why:  If symptoms worsen. Contact information: [address] [id number] [place] [phone]         Reviewed expectations re: course of current medical issues. Questions answered. Outlined signs and symptoms indicating need for more acute intervention. Patient verbalized understanding. After Visit Summary given.  SUBJECTIVE: History from: patient. Robert Roman is a 38 y.o. male who reports persistent pain of his left lower ribs; described as aching without radiation. Onset: abrupt, 3 days ago. Injury/trama: yes, reports that his wife accidentally kneed him in the ribs while he was supine; immediate discomfort noted; does not limit him significantly; no OTC analgesics taken. Symptoms have progressed to a point and plateaued since beginning. Aggravating factors: movement and certain positions. Alleviating  factors: rest. No associated SOB or respiratory difficulties reported. Extremity sensation changes or weakness: none. For the past day he reports feeling of abdominal bloating. Slightly decreased appetite. No n/v/d. No back pain reported. Ambulatory without difficulty. No specific reflux symptoms. No specific aggravating or alleviating factors reported concerning feeling of abdominal bloating. Normal bowel/bladder habits.  Social History   Tobacco Use  Smoking Status Never Smoker  Smokeless Tobacco Never Used  Tobacco Comment   smoked as a teenager   Past Surgical History:  Procedure Laterality Date  . NASAL SEPTOPLASTY W/ TURBINOPLASTY Bilateral 07/19/2015   Procedure: NASAL SEPTOPLASTY WITH BILATERAL INFERIOR TURBINATE REDUCTION;  Surgeon: Jerrell Belfast, MD;  Location: [place];  Service: ENT;  Laterality: Bilateral;  . TONSILLECTOMY Bilateral 07/19/2015   Procedure: BILATERAL TONSILLECTOMY;  Surgeon: Jerrell Belfast, MD;  Location: [place];  Service: ENT;  Laterality: Bilateral;  . TONSILLECTOMY    . WISDOM TOOTH EXTRACTION      ROS: As per HPI. All other systems negative    OBJECTIVE:  Vitals:   05/03/18 1750  BP: 116/78  Pulse: 60  Resp: 18  Temp: 97.8 F (36.6 C)  TempSrc: Temporal  SpO2: 100%    General appearance: alert; no distress HEENT: [place]; AT CV: RRR Lungs: CTAB; unlabored respirations Chest Wall: point tenderness over L lower ribcage; no bruising or signs of trauma noted Abd: soft; non-tender; non-distended; does report mild to moderate epigastric discomfort with palpation; no guarding or rebound tenderness Extremities: without swelling or edema Skin: warm and dry; no visible rashes Neurologic: gait normal Psychological: alert and cooperative; normal mood and affect  No Known Allergies  Past Medical History:  Diagnosis Date  . Allergy    Social History   Socioeconomic History  . Marital status: Married    Spouse  name: Not on file  . Number of children: Not on file  . Years of education: Not on file  . Highest education level: Not on file  Occupational History  . Not on file  Social Needs  . Financial resource strain: Not on file  . Food insecurity:    Worry: Not on file    Inability: Not on file  . Transportation needs:    Medical: Not on file    Non-medical: Not on file  Tobacco Use  . Smoking status: Never Smoker  . Smokeless tobacco: Never Used  . Tobacco comment: smoked as a teenager  Substance and Sexual Activity  . Alcohol use: Yes    Alcohol/week: 0.0 standard drinks    Comment: 2-3 drinks a few days per week  . Drug use: No  . Sexual activity: Not on file  Lifestyle  . Physical activity:    Days per week: Not on file    Minutes per session: Not on file  . Stress: Not on file  Relationships  . Social connections:    Talks on phone: Not on file    Gets together: Not on file    Attends religious service: Not on file    Active member of club or organization: Not on file    Attends meetings of clubs or organizations: Not on file    Relationship status: Not on file  Other Topics Concern  . Not on file  Social History Narrative   Work or School: Proofreader Situation: lives with wife, two kids      Spiritual Beliefs: Christian      Lifestyle: regular CV exercise 30 minutes and weight lifting 3 days per week; diet is good      Family History  Problem Relation Age of Onset  . Thyroid disease Mother   . Irritable bowel syndrome Mother   . Hypertension Sister   . Heart disease Maternal Grandfather   . Heart disease Paternal Grandfather    Past Surgical History:  Procedure Laterality Date  . NASAL SEPTOPLASTY W/ TURBINOPLASTY Bilateral 07/19/2015   Procedure: NASAL SEPTOPLASTY WITH BILATERAL INFERIOR TURBINATE REDUCTION;  Surgeon: Jerrell Belfast, MD;  Location: [place];  Service: ENT;  Laterality: Bilateral;  .  TONSILLECTOMY Bilateral 07/19/2015   Procedure: BILATERAL TONSILLECTOMY;  Surgeon: Jerrell Belfast, MD;  Location: [place];  Service: ENT;  Laterality: Bilateral;  . TONSILLECTOMY    . WISDOM TOOTH EXTRACTION        Vanessa Kick, MD 05/04/18 [phone]

## 2018-11-14 ENCOUNTER — Telehealth: Payer: No Typology Code available for payment source | Admitting: Family

## 2018-11-14 DIAGNOSIS — J069 Acute upper respiratory infection, unspecified: Secondary | ICD-10-CM

## 2018-11-14 MED ORDER — FLUTICASONE PROPIONATE 50 MCG/ACT NA SUSP: 2.0000 | Freq: Every day | NASAL | 6 refills | Status: DC

## 2018-11-14 NOTE — Progress Notes (Signed)
We are sorry you are not feeling well.  Here is how we plan to help!  Based on what you have shared with me, it looks like you may have a viral upper respiratory infection.  Upper respiratory infections are caused by a large number of viruses; however, rhinovirus is the most common cause.    However you do need to be tested for COVID. You can go to one of the  testing sites listed below, while they are opened (see hours). You do need to self isolate until your results return and if positive 14 days from when your symptoms started and until you are 3 days symptom free.   Testing Locations (Monday - Friday, 8 a.m. - 3:30 p.m.) . [place]: [facility] at [facility], [address], [place], [place]: [place], [place], [place], [place] (entrance off [address])  . [place], [place], [place] (across from [facility]) Approximately 5 minutes was spent documenting and reviewing patient's chart.    Symptoms vary from person to person, with common symptoms including sore throat, cough, and fatigue or lack of energy.  A low-grade fever of up to 100.4 may present, but is often uncommon.  Symptoms vary however, and are closely related to a person's age or underlying illnesses.  The most common symptoms associated with an upper respiratory infection are nasal discharge or congestion, cough, sneezing, headache and pressure in the ears and face.  These symptoms usually persist for about 3 to 10 days, but can last up to 2 weeks.  It is important to know that upper respiratory infections do not cause serious illness or complications in most cases.    Upper respiratory infections can be transmitted from person to person, with the most common method of transmission being a person's hands.  The virus is able to live on the skin and can infect other persons for up to 2 hours after direct contact.  Also,  these can be transmitted when someone coughs or sneezes; thus, it is important to cover the mouth to reduce this risk.  To keep the spread of the illness at [place], good hand hygiene is very important.  This is an infection that is most likely caused by a virus. There are no specific treatments other than to help you with the symptoms until the infection runs its course.  We are sorry you are not feeling well.  Here is how we plan to help!   For nasal congestion, you may use an oral decongestants such as Mucinex D or if you have glaucoma or high blood pressure use plain Mucinex.  Saline nasal spray or nasal drops can help and can safely be used as often as needed for congestion.  For your congestion, I have prescribed Fluticasone nasal spray one spray in each nostril twice a day  If you do not have a history of heart disease, hypertension, diabetes or thyroid disease, prostate/bladder issues or glaucoma, you may also use Sudafed to treat nasal congestion.  It is highly recommended that you consult with a pharmacist or your primary care physician to ensure this medication is safe for you to take.     If you have a cough, you may use cough suppressants such as Delsym and Robitussin.  If you have glaucoma or high blood pressure, you can also use Coricidin HBP.     If you have a sore or scratchy throat,  use a saltwater gargle-  to  teaspoon of salt dissolved in a 4-ounce to 8-ounce glass of warm water.  Gargle the solution for approximately 15-30 seconds and then spit.  It is important not to swallow the solution.  You can also use throat lozenges/cough drops and Chloraseptic spray to help with throat pain or discomfort.  Warm or cold liquids can also be helpful in relieving throat pain.  For headache, pain or general discomfort, you can use Ibuprofen or Tylenol as directed.   Some authorities believe that zinc sprays or the use of Echinacea may shorten the course of your symptoms.   HOME CARE . Only  take medications as instructed by your medical team. . Be sure to drink plenty of fluids. Water is fine as well as fruit juices, sodas and electrolyte beverages. You may want to stay away from caffeine or alcohol. If you are nauseated, try taking small sips of liquids. How do you know if you are getting enough fluid? Your urine should be a pale yellow or almost colorless. . Get rest. . Taking a steamy shower or using a humidifier may help nasal congestion and ease sore throat pain. You can place a towel over your head and breathe in the steam from hot water coming from a faucet. . Using a saline nasal spray works much the same way. . Cough drops, hard candies and sore throat lozenges may ease your cough. . Avoid close contacts especially the very young and the elderly . Cover your mouth if you cough or sneeze . Always remember to wash your hands.   GET HELP RIGHT AWAY IF: . You develop worsening fever. . If your symptoms do not improve within 10 days . You develop yellow or green discharge from your nose over 3 days. . You have coughing fits . You develop a severe head ache or visual changes. . You develop shortness of breath, difficulty breathing or start having chest pain . Your symptoms persist after you have completed your treatment plan  MAKE SURE YOU   Understand these instructions.  Will watch your condition.  Will get help right away if you are not doing well or get worse.  Your e-visit answers were reviewed by a board certified advanced clinical practitioner to complete your personal care plan. Depending upon the condition, your plan could have included both over the counter or prescription medications. Please review your pharmacy choice. If there is a problem, you may call our nursing hot line at and have the prescription routed to another pharmacy. Your safety is important to [place]. If you have drug allergies check your prescription carefully.   You can use MyChart to ask  questions about today's visit, request a non-urgent call back, or ask for a work or school excuse for 24 hours related to this e-Visit. If it has been greater than 24 hours you will need to follow up with your provider, or enter a new e-Visit to address those concerns. You will get an e-mail in the next two days asking about your experience.  I hope that your e-visit has been valuable and will speed your recovery. Thank you for using e-visits.

## 2018-11-15 ENCOUNTER — Ambulatory Visit (INDEPENDENT_AMBULATORY_CARE_PROVIDER_SITE_OTHER): Payer: No Typology Code available for payment source | Admitting: Internal Medicine

## 2018-11-15 ENCOUNTER — Encounter: Payer: Self-pay | Admitting: Family Medicine

## 2018-11-15 ENCOUNTER — Other Ambulatory Visit: Payer: Self-pay

## 2018-11-15 ENCOUNTER — Encounter: Payer: Self-pay | Admitting: Internal Medicine

## 2018-11-15 ENCOUNTER — Ambulatory Visit: Payer: No Typology Code available for payment source | Admitting: Family Medicine

## 2018-11-15 VITALS — BP 120/80 | HR 83 | Temp 97.7°F | Wt 157.0 lb

## 2018-11-15 DIAGNOSIS — R233 Spontaneous ecchymoses: Secondary | ICD-10-CM | POA: Diagnosis not present

## 2018-11-15 DIAGNOSIS — R590 Localized enlarged lymph nodes: Secondary | ICD-10-CM | POA: Diagnosis not present

## 2018-11-15 DIAGNOSIS — L989 Disorder of the skin and subcutaneous tissue, unspecified: Secondary | ICD-10-CM

## 2018-11-15 DIAGNOSIS — R221 Localized swelling, mass and lump, neck: Secondary | ICD-10-CM

## 2018-11-15 MED ORDER — VALACYCLOVIR HCL 1 G PO TABS: 1000.0000 mg | ORAL_TABLET | Freq: Three times a day (TID) | ORAL | 0 refills | Status: DC

## 2018-11-15 MED FILL — FLUTICASONE PROP 50 MCG SPR: 50 | 30 days supply | Qty: 16 | Fill #0

## 2018-11-15 MED FILL — valACYclovir HCL 1 GM TABS: 1 | 7 days supply | Qty: 21 | Fill #0

## 2018-11-15 NOTE — Patient Instructions (Signed)
Posterior adenopathy is usually from viral infection  Although bacterial infection on skin could cause reactive adenopathy.   Because of the neuritis and red blotches on left tempe  Advise we begin  Antiviral as if this is  Early shingles  Although usually not such a large gland   Lymphadenopathy  Lymphadenopathy means that your lymph glands are swollen or larger than normal (enlarged). Lymph glands, also called lymph nodes, are collections of tissue that filter bacteria, viruses, and waste from your bloodstream. They are part of your body's disease-fighting system (immune system), which protects your body from germs. There may be different causes of lymphadenopathy, depending on where it is in your body. Some types go away on their own. Lymphadenopathy can occur anywhere that you have lymph glands, including these areas:  Neck (cervical lymphadenopathy).  Chest (mediastinal lymphadenopathy).  Lungs (hilar lymphadenopathy).  Underarms (axillary lymphadenopathy).  Groin (inguinal lymphadenopathy). When your immune system responds to germs, infection-fighting cells and fluid build up in your lymph glands. This causes some swelling and enlargement. If the lymph glands do not go back to normal after you have an infection or disease, your health care provider may do tests. These tests help to monitor your condition and find the reason why the glands are still swollen and enlarged. Follow these instructions at home:  Get plenty of rest.  Take over-the-counter and prescription medicines only as told by your health care provider. Your health care provider may recommend over-the-counter medicines for pain.  If directed, apply heat to swollen lymph glands as often as told by your health care provider. Use the heat source that your health care provider recommends, such as a moist heat pack or a heating pad. ? Place a towel between your skin and the heat source. ? Leave the heat on for 20-30 minutes.  ? Remove the heat if your skin turns bright red. This is especially important if you are unable to feel pain, heat, or cold. You may have a greater risk of getting burned.  Check your affected lymph glands every day for changes. Check other lymph gland areas as told by your health care provider. Check for changes such as: ? More swelling. ? Sudden increase in size. ? Redness or pain. ? Hardness.  Keep all follow-up visits as told by your health care provider. This is important. Contact a health care provider if you have:  Swelling that gets worse or spreads to other areas.  Problems with breathing.  Lymph glands that: ? Are still swollen after 2 weeks. ? Have suddenly gotten bigger. ? Are red, painful, or hard.  A fever or chills.  Fatigue.  A sore throat.  Pain in your abdomen.  Weight loss.  Night sweats. Get help right away if you have:  Fluid leaking from an enlarged lymph gland.  Severe pain.  Chest pain.  Shortness of breath. Summary  Lymphadenopathy means that your lymph glands are swollen or larger than normal (enlarged).  Lymph glands (also called lymph nodes) are collections of tissue that filter bacteria, viruses, and waste from the bloodstream. They are part of your body's disease-fighting system (immune system).  Lymphadenopathy can occur anywhere that you have lymph glands.  If your enlarged and swollen lymph glands do not go back to normal after you have an infection or disease, your health care provider may do tests to monitor your condition and find the reason why the glands are still swollen and enlarged.  Check your affected lymph glands every  day for changes. Check other lymph gland areas as told by your health care provider. This information is not intended to replace advice given to you by your health care provider. Make sure you discuss any questions you have with your health care provider. Document Released: 01/14/2008 Document Revised:  03/19/2017 Document Reviewed: 02/19/2017 Elsevier Patient Education  2020 [employer].

## 2018-11-15 NOTE — Progress Notes (Signed)
Virtual Visit via Video Note  I connected with Robert Roman  on 11/15/18 at 12:40 PM EDT by a video enabled telemedicine application and verified that I am speaking with the correct person using two identifiers.  Location patient: home Location provider:work or home office Persons participating in the virtual visit: patient, provider  I discussed the limitations of evaluation and management by telemedicine and the availability of in person appointments. The patient expressed understanding and agreed to proceed.   HPI:  Acute visit for bumps on his L side of his head and neck: -These started a few days ago over the weekend as one 2-3 cm lump that was mobile and soft on the L posterior neck -he now has developed several more lumps up the neck and now in the L temple region as well -now with quite a bit of pain around the initial lump but without redness or warmth that he can tell and denies punctate - denies bites -denies fever or malaise or illness -no other symptoms   ROS: See pertinent positives and negatives per HPI.  Past Medical History:  Diagnosis Date  . Allergy     Past Surgical History:  Procedure Laterality Date  . NASAL SEPTOPLASTY W/ TURBINOPLASTY Bilateral 07/19/2015   Procedure: NASAL SEPTOPLASTY WITH BILATERAL INFERIOR TURBINATE REDUCTION;  Surgeon: Jerrell Belfast, MD;  Location: [facility];  Service: ENT;  Laterality: Bilateral;  . TONSILLECTOMY Bilateral 07/19/2015   Procedure: BILATERAL TONSILLECTOMY;  Surgeon: Jerrell Belfast, MD;  Location: [facility];  Service: ENT;  Laterality: Bilateral;  . TONSILLECTOMY    . WISDOM TOOTH EXTRACTION      Family History  Problem Relation Age of Onset  . Thyroid disease Mother   . Irritable bowel syndrome Mother   . Hypertension Sister   . Heart disease Maternal Grandfather   . Heart disease Paternal Grandfather     SOCIAL HX: see hpi   Current Outpatient Medications:  .  fluticasone  (FLONASE) 50 MCG/ACT nasal spray, Place 2 sprays into both nostrils daily., Disp: 16 g, Rfl: 6 .  loratadine (CLARITIN) 10 MG tablet, Take 10 mg by mouth daily as needed. , Disp: , Rfl:   EXAM:  VITALS per patient if applicable: denies fever  GENERAL: alert, oriented, appears well and in no acute distress  HEENT: atraumatic, conjunttiva clear, no obvious abnormalities on inspection of external nose and ears, two nickle sized swellings in the temple region that appear flesh colored, questionable erythema  NECK: normal movements of the head and neck - with obvious visible flesh colored swelling in the posterior L neck ~ 2-3 cm - ? Erythema on my exam, but patient feels is from sun and not related  LUNGS: on inspection no signs of respiratory distress, breathing rate appears normal, no obvious gross SOB, gasping or wheezing  CV: no obvious cyanosis  MS: moves all visible extremities without noticeable abnormality  PSYCH/NEURO: pleasant and cooperative, no obvious depression or anxiety, speech and thought processing grossly intact  ASSESSMENT AND PLAN:  Discussed the following assessment and plan:  Neck swelling Face lesion  Discussed potential etiologies, options for further evaluation, risks, precautions. Feel this warrants referral for an in person exam to assist in proper triage, diagnoses and management - limited in ability to examine well on video visit. He agrees to referral for in - person care and prefers to be seen at a [facility] location. Sent urgent message to schedulers and Vita Barley to assist. Advised he call us back  if has not heard from someone in the next 1 hour and go to urgent care if is unable to get prompt evaluation in our offices today. He agreed.   I discussed the assessment and treatment plan with the patient. The patient was provided an opportunity to ask questions and all were answered. The patient agreed with the plan and demonstrated an understanding of the  instructions.   Addendum: received confirmation from scheduler taking care of this. I will not charge for this visit.  Lucretia Kern, DO

## 2018-11-15 NOTE — Progress Notes (Signed)
Chief Complaint  Patient presents with   lump on neck    HPI: Robert Roman 38 y.o.   Presents with 3 days of  evolving lump on left neck and then shooting pains and  sensitivity left side of head  And now some red areas on left temple not that tender  But  [place].    No fever no sore throat    Family wife audiologist   2 school age kids   Not ill .    Works in interventional radiology    No specific exposures   Did e vi ist  Incase  gvien allergy med but  No  Sig resp infection sx otherwise cough gi sx.   Called health at work and not felt to be covid  So no restrictions  Got hair cut over weekend but sx pre dated  There is a small tender area right occiput   No  Skin lesion  Doctors work with him did [place] on lump and  Said definitely a LN  And no [place] other major ant ln    ROS: See pertinent positives and negatives per HPI. No cat bit kitten exposures  recent bites  Skin lesions ear sx   Neck pain was worried about  meningitis  Past Medical History:  Diagnosis Date   Allergy     Family History  Problem Relation Age of Onset   Thyroid disease Mother    Irritable bowel syndrome Mother    Hypertension Sister    Heart disease Maternal Grandfather    Heart disease Paternal Grandfather     Social History   Socioeconomic History   Marital status: Married    Spouse name: Not on file   Number of children: Not on file   Years of education: Not on file   Highest education level: Not on file  Occupational History   Not on file  Social Needs   Financial resource strain: Not on file   Food insecurity    Worry: Not on file    Inability: Not on file   Transportation needs    Medical: Not on file    Non-medical: Not on file  Tobacco Use   Smoking status: Never Smoker   Smokeless tobacco: Never Used   Tobacco comment: smoked as a teenager  Substance and Sexual Activity   Alcohol use: Yes    Alcohol/week: 0.0 standard drinks    Comment: 2-3 drinks a few days per  week   Drug use: No   Sexual activity: Not on file  Lifestyle   Physical activity    Days per week: Not on file    Minutes per session: Not on file   Stress: Not on file  Relationships   Social connections    Talks on phone: Not on file    Gets together: Not on file    Attends religious service: Not on file    Active member of club or organization: Not on file    Attends meetings of clubs or organizations: Not on file    Relationship status: Not on file  Other Topics Concern   Not on file  Social History Narrative   Work or School: interventional radiology tech      Home Situation: lives with wife, two kids      Spiritual Beliefs: Christian      Lifestyle: regular CV exercise 30 minutes and weight lifting 3 days per week; diet is good  Outpatient Medications Prior to Visit  Medication Sig Dispense Refill   fluticasone (FLONASE) 50 MCG/ACT nasal spray Place 2 sprays into both nostrils daily. 16 g 6   loratadine (CLARITIN) 10 MG tablet Take 10 mg by mouth daily as needed.      No facility-administered medications prior to visit.      EXAM:  BP 120/80 (BP Location: Left Arm, Patient Position: Sitting, Cuff Size: Normal)    Pulse 83    Temp 97.7 F (36.5 C) (Oral)    Wt 157 lb (71.2 kg)    SpO2 95%    BMI 23.87 kg/m   Body mass index is 23.87 kg/m.  GENERAL: vitals reviewed and listed above, alert, oriented, appears well hydrated and in no acute distress has obv left post adenopathy  HEENT: atraumatic, conjunctiva  clear, no obvious abnormalities on inspection of external nose and ears tm eyes no conjunctivitis nad  OP : no lesion edema or exudate  Obvious shirt exam  NECK: no meningismus.    Left with large 3+ dc mobile discrete  Node and small in chain? Post occiput no scalp lesion seen   Area of tenderness is small  seed sized palpation   Area  LUNGS: clear to auscultation bilaterally, no wheezes, rales or rhonchi, good air movement CV: HRRR, no clubbing  cyanosis or  peripheral edema nl cap refill  Abdomen:  Sof,t normal bowel sounds without hepatosplenomegaly, no guarding rebound or masses no CVA tenderness LN no [place] axillary inguinal adenopathy  No anterior adenopathy  No pa or pa nodes felt  SKIN:  faint blotches on left temple  One palpable tender but  No vesicle skin changes   Has soe sunburn redness on neck ( says mowed lawn)  MS: moves all extremities without noticeable focal  abnormality PSYCH: pleasant and cooperative, no obvious depression or anxiety  ASSESSMENT AND PLAN:  Discussed the following assessment and plan:   ICD-10-CM   1. Posterior cervical adenopathy  R59.0 CBC with Differential/Platelet    HIV Antibody (routine testing w rflx)    Epstein-Barr virus VCA antibody panel    CMV IgM  2. Skin spots, red  R23.3 CBC with Differential/Platelet    HIV Antibody (routine testing w rflx)    Epstein-Barr virus VCA antibody panel    CMV IgM   Neuritis feeling pain head  Left sided  Consider early atypical zoster ( he had varicella  Disease)  Most likely a viral cause of reactive adenopathy but diff dx discussed  . Check for mono cmv  Cbc hiv ( low risk)   Consider cat scratch but no exposures  Adeno atypical etc   Or reactive bacteria but cannot find a source today.   Watch  Redness skin changes and pix and keep [place] informed   To check for sx of bacterial infection ( dont see today)  NO other alarm sx  Or systemic adenopathy   No evidence of meningitis reported to patient  Disc alarm sx   Plan update [place] before weekend  And then go from there and lab results  my char and calling  -Patient advised to return or notify health care team  if symptoms worsen ,persist or new concerns arise.  Patient Instructions  Posterior adenopathy is usually from viral infection  Although bacterial infection on skin could cause reactive adenopathy.   Because of the neuritis and red blotches on left tempe  Advise we begin  Antiviral as if this is   Early  shingles  Although usually not such a large gland   Lymphadenopathy  Lymphadenopathy means that your lymph glands are swollen or larger than normal (enlarged). Lymph glands, also called lymph nodes, are collections of tissue that filter bacteria, viruses, and waste from your bloodstream. They are part of your body's disease-fighting system (immune system), which protects your body from germs. There may be different causes of lymphadenopathy, depending on where it is in your body. Some types go away on their own. Lymphadenopathy can occur anywhere that you have lymph glands, including these areas:  Neck (cervical lymphadenopathy).  Chest (mediastinal lymphadenopathy).  Lungs (hilar lymphadenopathy).  Underarms (axillary lymphadenopathy).  Groin (inguinal lymphadenopathy). When your immune system responds to germs, infection-fighting cells and fluid build up in your lymph glands. This causes some swelling and enlargement. If the lymph glands do not go back to normal after you have an infection or disease, your health care provider may do tests. These tests help to monitor your condition and find the reason why the glands are still swollen and enlarged. Follow these instructions at home:  Get plenty of rest.  Take over-the-counter and prescription medicines only as told by your health care provider. Your health care provider may recommend over-the-counter medicines for pain.  If directed, apply heat to swollen lymph glands as often as told by your health care provider. Use the heat source that your health care provider recommends, such as a moist heat pack or a heating pad. ? Place a towel between your skin and the heat source. ? Leave the heat on for 20-30 minutes. ? Remove the heat if your skin turns bright red. This is especially important if you are unable to feel pain, heat, or cold. You may have a greater risk of getting burned.  Check your affected lymph glands every day for  changes. Check other lymph gland areas as told by your health care provider. Check for changes such as: ? More swelling. ? Sudden increase in size. ? Redness or pain. ? Hardness.  Keep all follow-up visits as told by your health care provider. This is important. Contact a health care provider if you have:  Swelling that gets worse or spreads to other areas.  Problems with breathing.  Lymph glands that: ? Are still swollen after 2 weeks. ? Have suddenly gotten bigger. ? Are red, painful, or hard.  A fever or chills.  Fatigue.  A sore throat.  Pain in your abdomen.  Weight loss.  Night sweats. Get help right away if you have:  Fluid leaking from an enlarged lymph gland.  Severe pain.  Chest pain.  Shortness of breath. Summary  Lymphadenopathy means that your lymph glands are swollen or larger than normal (enlarged).  Lymph glands (also called lymph nodes) are collections of tissue that filter bacteria, viruses, and waste from the bloodstream. They are part of your body's disease-fighting system (immune system).  Lymphadenopathy can occur anywhere that you have lymph glands.  If your enlarged and swollen lymph glands do not go back to normal after you have an infection or disease, your health care provider may do tests to monitor your condition and find the reason why the glands are still swollen and enlarged.  Check your affected lymph glands every day for changes. Check other lymph gland areas as told by your health care provider. This information is not intended to replace advice given to you by your health care provider. Make sure you discuss any questions you have with your  health care provider. Document Released: 01/14/2008 Document Revised: 03/19/2017 Document Reviewed: 02/19/2017 Elsevier Patient Education  2020 Ilion Ross Hefferan M.D.

## 2018-11-16 LAB — CBC WITH DIFFERENTIAL/PLATELET
Basophils Absolute: 0 10*3/uL (ref 0.0–0.1)
Basophils Relative: 0.6 % (ref 0.0–3.0)
Eosinophils Absolute: 0.1 10*3/uL (ref 0.0–0.7)
Eosinophils Relative: 1.1 % (ref 0.0–5.0)
HCT: 43.6 % (ref 39.0–52.0)
Hemoglobin: 14.8 g/dL (ref 13.0–17.0)
Lymphocytes Relative: 30.1 % (ref 12.0–46.0)
Lymphs Abs: 1.8 10*3/uL (ref 0.7–4.0)
MCHC: 34 g/dL (ref 30.0–36.0)
MCV: 87.8 fl (ref 78.0–100.0)
Monocytes Absolute: 0.6 10*3/uL (ref 0.1–1.0)
Monocytes Relative: 9.7 % (ref 3.0–12.0)
Neutro Abs: 3.5 10*3/uL (ref 1.4–7.7)
Neutrophils Relative %: 58.5 % (ref 43.0–77.0)
Platelets: 248 10*3/uL (ref 150.0–400.0)
RBC: 4.97 Mil/uL (ref 4.22–5.81)
RDW: 13.2 % (ref 11.5–15.5)
WBC: 6 10*3/uL (ref 4.0–10.5)

## 2018-11-18 LAB — CMV IGM: CMV IgM: <30 AU/mL

## 2018-11-18 LAB — HIV ANTIBODY (ROUTINE TESTING W REFLEX): HIV 1&2 Ab, 4th Generation: NONREACTIVE

## 2018-11-18 LAB — EPSTEIN-BARR VIRUS VCA ANTIBODY PANEL
EBV NA IgG: >600 U/mL — ABNORMAL HIGH
EBV VCA IgG: 567 U/mL — ABNORMAL HIGH
EBV VCA IgM: <36 U/mL

## 2018-11-18 MED ORDER — CEPHALEXIN 500 MG PO CAPS: 500.0000 mg | ORAL_CAPSULE | Freq: Three times a day (TID) | ORAL | 0 refills | Status: AC

## 2018-11-18 MED FILL — CEPHALEXIN 500 MG CAPSULE: 500 | 7 days supply | Qty: 21 | Fill #0

## 2018-11-18 NOTE — Telephone Encounter (Signed)
So the cmv is negative also   We can add antibiotic "just in case    " and stay on the valtrex   kelfex  500 mg 1 po tid for 7 days   I will send this in  To [facility]

## 2019-05-16 ENCOUNTER — Telehealth (INDEPENDENT_AMBULATORY_CARE_PROVIDER_SITE_OTHER): Payer: No Typology Code available for payment source | Admitting: Family Medicine

## 2019-05-16 ENCOUNTER — Encounter: Payer: Self-pay | Admitting: Family Medicine

## 2019-05-16 ENCOUNTER — Other Ambulatory Visit: Payer: Self-pay

## 2019-05-16 DIAGNOSIS — M26609 Unspecified temporomandibular joint disorder, unspecified side: Secondary | ICD-10-CM

## 2019-05-16 DIAGNOSIS — R0989 Other specified symptoms and signs involving the circulatory and respiratory systems: Secondary | ICD-10-CM

## 2019-05-16 NOTE — Patient Instructions (Signed)
For the sensation in the throat: 1) try 2 weeks of over the counter dose of Nexium or Prilosec once daily. 2) if the acid medication does not work, then try 2 weeks of claritin daily with flonase 2 sprays each nostril daily for 2 weeks. If worsening, or if symptoms not resolved with above treatment - please schedule appointment with your Ear, Nose and Throat specialist for evaluation.  For the Jaw issues: -soft foods for 1 week -I will ask my assistant to send you some home exercises -follow up and/or see your dentist or Ear, Nose and Throat specialist if persists  Please schedule appointment with Dr. Ethlyn Gallery in 3-4 weeks. I sent a message to the schedulers to assist. If they do not contact you within 2 business days, please call our office to request the appointment.  I hope you feel better soon!

## 2019-05-16 NOTE — Progress Notes (Signed)
Virtual Visit via Video Note  I connected with Robert Roman  on 05/16/19 at  5:00 PM EST by a video enabled telemedicine application and verified that I am speaking with the correct person using two identifiers.  Location patient: home Location provider:work or home office Persons participating in the virtual visit: patient, provider  I discussed the limitations of evaluation and management by telemedicine and the availability of in person appointments. The patient expressed understanding and agreed to proceed.   HPI:  Acute visit for Acid Reflux: -started about 2 months ago, was intermittent, more regular lately -notices sensation in throat mostly at night, ? Reflux, sometime during the days as well -occ during the day as well -denies CP pain, sore throat, heartburn, nausea, vomiting -history of intermittent heartburn with spicy foods in the past -does have a hx of nasal allergies chronically, takes Claritin in allergy seasons -smoked in highschool only  TMJ: -R TMJ pain started about 3-4 days ago -feels like in muscles of the jaw -woke up with this -worse with chewing and, opening mouth wide and pushing jaw to the L -denies catching, clicking, popping  ROS: See pertinent positives and negatives per HPI.  Past Medical History:  Diagnosis Date  . Allergy     Past Surgical History:  Procedure Laterality Date  . NASAL SEPTOPLASTY W/ TURBINOPLASTY Bilateral 07/19/2015   Procedure: NASAL SEPTOPLASTY WITH BILATERAL INFERIOR TURBINATE REDUCTION;  Surgeon: Jerrell Belfast, MD;  Location: [facility];  Service: ENT;  Laterality: Bilateral;  . TONSILLECTOMY Bilateral 07/19/2015   Procedure: BILATERAL TONSILLECTOMY;  Surgeon: Jerrell Belfast, MD;  Location: [facility];  Service: ENT;  Laterality: Bilateral;  . TONSILLECTOMY    . WISDOM TOOTH EXTRACTION      Family History  Problem Relation Age of Onset  . Thyroid disease Mother   . Irritable bowel syndrome  Mother   . Hypertension Sister   . Heart disease Maternal Grandfather   . Heart disease Paternal Grandfather     SOCIAL HX: see hpi   Current Outpatient Medications:  .  fluticasone (FLONASE) 50 MCG/ACT nasal spray, Place 2 sprays into both nostrils daily., Disp: 16 g, Rfl: 6 .  loratadine (CLARITIN) 10 MG tablet, Take 10 mg by mouth daily as needed. , Disp: , Rfl:   EXAM:  VITALS per patient if applicable:  GENERAL: alert, oriented, appears well and in no acute distress  HEENT: atraumatic, conjunttiva clear, no obvious abnormalities on inspection of external nose and ears  NECK: normal movements of the head and neck  LUNGS: on inspection no signs of respiratory distress, breathing rate appears normal, no obvious gross SOB, gasping or wheezing  CV: no obvious cyanosis  MS: moves all visible extremities without noticeable abnormality  PSYCH/NEURO: pleasant and cooperative, no obvious depression or anxiety, speech and thought processing grossly intact  ASSESSMENT AND PLAN:  Discussed the following assessment and plan:  Globus sensation  TMJ (temporomandibular joint disorder)  -we discussed possible serious and likely etiologies, options for evaluation and workup, limitations of telemedicine visit vs in person visit, treatment, treatment risks and precautions. Pt prefers to treat via telemedicine empirically rather then risking or undertaking an in person visit at this moment.  For the globus sensation opted to try trial of low dose PPI and dietary changes for 2 weeks, then allergy regimen or antihistamine and INS for 2 weeks if that is ineffective for most common causes. Advised follow up in 3-4 weeks with ENT evaluation if symptoms worsen  or persist. For the jaw issues opted to try softer foods for 5-7 days, see dentist, HEP - asked assistant to send to him. Follow up and ENT or dental eval if persists or worsens. He needs PCP and wants to schedule visit with Dr. Ethlyn Gallery in  3-4 weeks. Sent message to scheduling to assist. Patient agrees to seek prompt in person care if worsening, new symptoms arise, or if is not improving with treatment. He has an ENT specialist, Dr. Wilburn Cornelia.    I discussed the assessment and treatment plan with the patient. The patient was provided an opportunity to ask questions and all were answered. The patient agreed with the plan and demonstrated an understanding of the instructions.   The patient was advised to call back or seek an in-person evaluation if the symptoms worsen or if the condition fails to improve as anticipated.   Lucretia Kern, DO

## 2019-05-17 ENCOUNTER — Telehealth: Payer: Self-pay | Admitting: *Deleted

## 2019-05-17 NOTE — Telephone Encounter (Signed)
TMJ exercises were mailed to the pts home address.

## 2019-05-17 NOTE — Telephone Encounter (Signed)
-----   Message from Lucretia Kern, DO sent at 05/16/2019  5:42 PM EST ----- Wendie Simmer,  Could you please send the TMJ exercises to [facility]? Thanks!!!

## 2019-06-14 ENCOUNTER — Other Ambulatory Visit: Payer: Self-pay

## 2019-06-14 ENCOUNTER — Telehealth (INDEPENDENT_AMBULATORY_CARE_PROVIDER_SITE_OTHER): Payer: No Typology Code available for payment source | Admitting: Family Medicine

## 2019-06-14 ENCOUNTER — Encounter: Payer: Self-pay | Admitting: Family Medicine

## 2019-06-14 DIAGNOSIS — R0989 Other specified symptoms and signs involving the circulatory and respiratory systems: Secondary | ICD-10-CM | POA: Diagnosis not present

## 2019-06-14 DIAGNOSIS — K219 Gastro-esophageal reflux disease without esophagitis: Secondary | ICD-10-CM | POA: Diagnosis not present

## 2019-06-14 DIAGNOSIS — Z13220 Encounter for screening for lipoid disorders: Secondary | ICD-10-CM | POA: Diagnosis not present

## 2019-06-14 MED ORDER — PANTOPRAZOLE SODIUM 40 MG PO TBEC: 40.0000 mg | DELAYED_RELEASE_TABLET | Freq: Every day | ORAL | 2 refills | Status: DC

## 2019-06-14 MED FILL — PANTOPRAZOLE SOD DR 40 MG T: 40 | 30 days supply | Qty: 30 | Fill #0

## 2019-06-14 NOTE — Addendum Note (Signed)
Addended by: Caren Macadam on: 06/14/2019 03:34 PM   Modules accepted: Orders

## 2019-06-14 NOTE — Progress Notes (Addendum)
Virtual Visit via Video Note  I connected with Robert Roman   on 06/14/19 at  2:30 PM EST by a video enabled telemedicine application and verified that I am speaking with the correct person using two identifiers.  Location patient: home Location provider:work office Persons participating in the virtual visit: patient, provider  I discussed the limitations of evaluation and management by telemedicine and the availability of in person appointments. The patient expressed understanding and agreed to proceed.   Robert Roman DOB: [birth year] Encounter date: 06/14/2019  This is a 39 y.o. male who presents to establish care. No chief complaint on file.   History of present illness: Last visit with Dr. Maudie Mercury was May 16, 2019.  We discussed acid reflux as well as globus sensation at that time.  She encouraged low-dose PPI and dietary changes as well as allergy treatment.  Also discussed TMJ at that time. He did try the prilosec x 2 weeks, then flonase and zyrtec. Hasn't had success with all of the above although his allergies did seem to improve.   Sx more at night after eating; in bed. Gives globus feeling and feels like he needs to burp. Intermittent; not always after eating. Started noticing in November; was mor intermittent when it started. No food sticking, no swallowing. Has had difficulty with pill swallowing regularly. Not having sx of acid reflux. Has had one episode of heartburn since last visit.   No issues with constipation/diarrhea.   20 mg omeprazole and taking in morning. Has occasionally done tums in the past.    Has 2 kids; 78 and 35.   No abd pain, no nausea. No fevers, chills.   Past Medical History:  Diagnosis Date  . Allergy    Past Surgical History:  Procedure Laterality Date  . NASAL SEPTOPLASTY W/ TURBINOPLASTY Bilateral 07/19/2015   Procedure: NASAL SEPTOPLASTY WITH BILATERAL INFERIOR TURBINATE REDUCTION;  Surgeon: Jerrell Belfast, MD;  Location: [facility];  Service: ENT;  Laterality: Bilateral;  . TONSILLECTOMY Bilateral 07/19/2015   Procedure: BILATERAL TONSILLECTOMY;  Surgeon: Jerrell Belfast, MD;  Location: [facility];  Service: ENT;  Laterality: Bilateral;  . TONSILLECTOMY    . WISDOM TOOTH EXTRACTION     No Known Allergies No outpatient medications have been marked as taking for the 06/14/19 encounter (Appointment) with Caren Macadam, MD.   Social History   Tobacco Use  . Smoking status: Never Smoker  . Smokeless tobacco: Never Used  . Tobacco comment: smoked as a teenager  Substance Use Topics  . Alcohol use: Yes    Alcohol/week: 0.0 standard drinks    Comment: 2-3 drinks a few days per week   Family History  Problem Relation Age of Onset  . Thyroid disease Mother   . Irritable bowel syndrome Mother   . Hypertension Sister   . Heart disease Maternal Grandfather   . Heart disease Paternal Grandfather      Review of Systems  Constitutional: Negative for chills, fatigue and fever.  Respiratory: Negative for cough, chest tightness, shortness of breath and wheezing.   Cardiovascular: Negative for chest pain, palpitations and leg swelling.    Objective:  There were no vitals taken for this visit.     BP today 137/88; 127/80's. Not had high blood pressure in past, but has noted a little elevation with diastolic in last year. Current weight: 160  BP Readings from Last 3 Encounters:  11/15/18 120/80  05/03/18 116/78  11/15/17 98/70  Wt Readings from Last 3 Encounters:  11/15/18 157 lb (71.2 kg)  11/15/17 159 lb 3.2 oz (72.2 kg)  06/22/17 162 lb 9.6 oz (73.8 kg)    EXAM:  GENERAL: alert, oriented, appears well and in no acute distress  HEENT: atraumatic, conjunctiva clear, no obvious abnormalities on inspection of external nose and ears  NECK: normal movements of the head and neck  LUNGS: on inspection no signs of respiratory distress, breathing rate appears normal, no obvious gross  SOB, gasping or wheezing  CV: no obvious cyanosis  MS: moves all visible extremities without noticeable abnormality  PSYCH/NEURO: pleasant and cooperative, no obvious depression or anxiety, speech and thought processing grossly intact   Assessment/Plan  1. Gastroesophageal reflux disease, unspecified whether esophagitis present Discussed diet, foods to avoid. Trial of protonix 30 minutes prior to dinner to help with decreasing sx in evening. Follow up pending labwork. Consider GI ref for any worsening sx.  2. Globus sensation See above.  - CBC with Differential/Platelet; Future - Comprehensive metabolic panel; Future - TSH; Future - T4, free; Future  3. Lipid screening - Lipid panel; Future   Follow up pending lab results/sx.  I discussed the assessment and treatment plan with the patient. The patient was provided an opportunity to ask questions and all were answered. The patient agreed with the plan and demonstrated an understanding of the instructions.   The patient was advised to call back or seek an in-person evaluation if the symptoms worsen or if the condition fails to improve as anticipated.  I provided 25 minutes of non-face-to-face time during this encounter.   Micheline Rough, MD   Addended: patient will complete labs at our office. h pylor breath testing changed to serum since we do not do breath testing. Although less accurate should help rule out h pylori.

## 2019-06-15 ENCOUNTER — Other Ambulatory Visit (INDEPENDENT_AMBULATORY_CARE_PROVIDER_SITE_OTHER): Payer: No Typology Code available for payment source

## 2019-06-15 ENCOUNTER — Telehealth: Payer: Self-pay | Admitting: *Deleted

## 2019-06-15 ENCOUNTER — Other Ambulatory Visit: Payer: Self-pay

## 2019-06-15 DIAGNOSIS — Z13220 Encounter for screening for lipoid disorders: Secondary | ICD-10-CM

## 2019-06-15 DIAGNOSIS — R0989 Other specified symptoms and signs involving the circulatory and respiratory systems: Secondary | ICD-10-CM

## 2019-06-15 LAB — H. PYLORI ANTIBODY, IGG: H Pylori IgG: NEGATIVE

## 2019-06-15 LAB — COMPREHENSIVE METABOLIC PANEL
ALT: 40 U/L (ref 0–53)
AST: 24 U/L (ref 0–37)
Albumin: 4.2 g/dL (ref 3.5–5.2)
Alkaline Phosphatase: 57 U/L (ref 39–117)
BUN: 12 mg/dL (ref 6–23)
CO2: 28 mEq/L (ref 19–32)
Calcium: 9.6 mg/dL (ref 8.4–10.5)
Chloride: 104 mEq/L (ref 96–112)
Creatinine, Ser: 1 mg/dL (ref 0.40–1.50)
GFR: 83.03 mL/min (ref >60.00)
Glucose, Bld: 96 mg/dL (ref 70–99)
Potassium: 4 mEq/L (ref 3.5–5.1)
Sodium: 139 mEq/L (ref 135–145)
Total Bilirubin: 0.4 mg/dL (ref 0.2–1.2)
Total Protein: 7.1 g/dL (ref 6.0–8.3)

## 2019-06-15 LAB — CBC WITH DIFFERENTIAL/PLATELET
Basophils Absolute: 0 10*3/uL (ref 0.0–0.1)
Basophils Relative: 0.4 % (ref 0.0–3.0)
Eosinophils Absolute: 0.1 10*3/uL (ref 0.0–0.7)
Eosinophils Relative: 2.4 % (ref 0.0–5.0)
HCT: 43.3 % (ref 39.0–52.0)
Hemoglobin: 15.3 g/dL (ref 13.0–17.0)
Lymphocytes Relative: 38.9 % (ref 12.0–46.0)
Lymphs Abs: 2 10*3/uL (ref 0.7–4.0)
MCHC: 35.2 g/dL (ref 30.0–36.0)
MCV: 86 fl (ref 78.0–100.0)
Monocytes Absolute: 0.4 10*3/uL (ref 0.1–1.0)
Monocytes Relative: 8.7 % (ref 3.0–12.0)
Neutro Abs: 2.5 10*3/uL (ref 1.4–7.7)
Neutrophils Relative %: 49.6 % (ref 43.0–77.0)
Platelets: 243 10*3/uL (ref 150.0–400.0)
RBC: 5.04 Mil/uL (ref 4.22–5.81)
RDW: 13 % (ref 11.5–15.5)
WBC: 5.1 10*3/uL (ref 4.0–10.5)

## 2019-06-15 LAB — LIPID PANEL
Cholesterol: 204 mg/dL — ABNORMAL HIGH (ref 0–200)
HDL: 32.7 mg/dL — ABNORMAL LOW (ref >39.00)
NonHDL: 171.63
Total CHOL/HDL Ratio: 6
Triglycerides: 271 mg/dL — ABNORMAL HIGH (ref 0.0–149.0)
VLDL: 54.2 mg/dL — ABNORMAL HIGH (ref 0.0–40.0)

## 2019-06-15 LAB — LDL CHOLESTEROL, DIRECT: Direct LDL: 127 mg/dL

## 2019-06-15 LAB — T4, FREE: Free T4: 0.67 ng/dL (ref 0.60–1.60)

## 2019-06-15 LAB — TSH: TSH: 1.58 u[IU]/mL (ref 0.35–4.50)

## 2019-06-15 NOTE — Telephone Encounter (Signed)
Robert Roman stated the pt is here for lab testing and informed her other tests than the Hpylori would be done.  After review of the chart, it appears labs were ordered but did not appear.  Robert Roman reviewed the chart and approved the labs to be entered from the office notes.  Robert Roman was informed.

## 2019-07-24 MED FILL — PANTOPRAZOLE SOD DR 40 MG T: 40 | 30 days supply | Qty: 30 | Fill #1

## 2019-09-20 ENCOUNTER — Encounter: Payer: Self-pay | Admitting: Gastroenterology

## 2019-11-06 DIAGNOSIS — K219 Gastro-esophageal reflux disease without esophagitis: Secondary | ICD-10-CM | POA: Insufficient documentation

## 2019-11-08 ENCOUNTER — Ambulatory Visit (INDEPENDENT_AMBULATORY_CARE_PROVIDER_SITE_OTHER): Payer: No Typology Code available for payment source | Admitting: Gastroenterology

## 2019-11-08 ENCOUNTER — Encounter: Payer: Self-pay | Admitting: Gastroenterology

## 2019-11-08 DIAGNOSIS — R0989 Other specified symptoms and signs involving the circulatory and respiratory systems: Secondary | ICD-10-CM | POA: Diagnosis not present

## 2019-11-08 DIAGNOSIS — R1310 Dysphagia, unspecified: Secondary | ICD-10-CM | POA: Diagnosis not present

## 2019-11-08 NOTE — Progress Notes (Signed)
HPI: This is a very pleasant 39 year old man who was referred to me by Caren Macadam, MD  to evaluate globus, swallowing sensation.    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  He is a very pleasant 39 year old man who started having a globus type swallowing sensation 8 or 9 months ago.  At first it was mainly in the evening but it has progressed to be nearly all day with just about every swallow.  He feels a lump type sensation in his throat, possibly top of esophagus.  He has tried proton pump inhibitor taken daily for 3 months without any relief in his symptoms.  He has no pyrosis.  No weight loss.  No vomiting.  No abdominal pains.  He did have a posterior left swollen lymph node several months ago that seems likely to have been viral related.  It has resolved.  He feels no lumps in his neck  Old Data Reviewed:  Blood work February 2019 showed normal CBC, normal complete metabolic profile, H. pylori serology was negative.   Review of systems: Pertinent positive and negative review of systems were noted in the above HPI section. All other review negative.   Past Medical History:  Diagnosis Date  . Allergy     Past Surgical History:  Procedure Laterality Date  . NASAL SEPTOPLASTY W/ TURBINOPLASTY Bilateral 07/19/2015   Procedure: NASAL SEPTOPLASTY WITH BILATERAL INFERIOR TURBINATE REDUCTION;  Surgeon: Jerrell Belfast, MD;  Location: [facility];  Service: ENT;  Laterality: Bilateral;  . TONSILLECTOMY Bilateral 07/19/2015   Procedure: BILATERAL TONSILLECTOMY;  Surgeon: Jerrell Belfast, MD;  Location: [facility];  Service: ENT;  Laterality: Bilateral;  . WISDOM TOOTH EXTRACTION      Current Outpatient Medications  Medication Sig Dispense Refill  . cetirizine (ZYRTEC) 10 MG tablet Take 10 mg by mouth daily.    . fluticasone (FLONASE) 50 MCG/ACT nasal spray Place 2 sprays into both nostrils daily. 16 g 6   No current  facility-administered medications for this visit.    Allergies as of 11/08/2019  . (No Known Allergies)    Family History  Problem Relation Age of Onset  . Thyroid disease Mother   . Irritable bowel syndrome Mother   . Hypertension Sister   . Heart disease Maternal Grandfather   . Heart disease Paternal Grandfather   . Esophageal cancer Maternal Uncle 76    Social History   Socioeconomic History  . Marital status: Married    Spouse name: Not on file  . Number of children: Not on file  . Years of education: Not on file  . Highest education level: Not on file  Occupational History  . Not on file  Tobacco Use  . Smoking status: Never Smoker  . Smokeless tobacco: Never Used  . Tobacco comment: smoked as a teenager  Substance and Sexual Activity  . Alcohol use: Yes    Alcohol/week: 0.0 standard drinks    Comment: 2 drinks a few days per week  . Drug use: No  . Sexual activity: Not on file  Other Topics Concern  . Not on file  Social History Narrative   Work or School: Proofreader Situation: lives with wife, two kids      Spiritual Beliefs: Christian      Lifestyle: regular CV exercise 30 minutes and weight lifting 3 days per week; diet is good      Social Determinants of Health  Financial Resource Strain:   . Difficulty of Paying Living Expenses:   Food Insecurity:   . Worried About Charity fundraiser in the Last Year:   . Arboriculturist in the Last Year:   Transportation Needs:   . Film/video editor (Medical):   Marland Kitchen Lack of Transportation (Non-Medical):   Physical Activity:   . Days of Exercise per Week:   . Minutes of Exercise per Session:   Stress:   . Feeling of Stress :   Social Connections:   . Frequency of Communication with Friends and Family:   . Frequency of Social Gatherings with Friends and Family:   . Attends Religious Services:   . Active Member of Clubs or Organizations:   . Attends Archivist  Meetings:   Marland Kitchen Marital Status:   Intimate Partner Violence:   . Fear of Current or Ex-Partner:   . Emotionally Abused:   Marland Kitchen Physically Abused:   . Sexually Abused:      Physical Exam: BP 118/66   Pulse 84   Ht 5\' 6"  (1.676 m)   Wt 162 lb (73.5 kg)   BMI 26.15 kg/m  Constitutional: generally well-appearing Psychiatric: alert and oriented x3 Eyes: extraocular movements intact Mouth: oral pharynx moist, no lesions Neck: supple no lymphadenopathy Cardiovascular: heart regular rate and rhythm Lungs: clear to auscultation bilaterally Abdomen: soft, nontender, nondistended, no obvious ascites, no peritoneal signs, normal bowel sounds Extremities: no lower extremity edema bilaterally Skin: no lesions on visible extremities   Assessment and plan: 39 y.o. male with globus swallowing sensation for several months, progressive.  He has no weight loss.  No pyrosis.  I explained that EGD is a very good next step for this but admitted that EGD does not get a great gold standard evaluation of the throat or back of the throat and so if my examination is normal, negative I would likely refer him to an ENT for oral pharyngeal evaluation.  He has had no pyrosis, no response his symptoms to proton pump inhibitors.  I do not think he needs to be on any medicines until his upper endoscopy.    Please see the "Patient Instructions" section for addition details about the plan.   Owens Loffler, MD [facility] 11/08/2019, 8:26 AM  Cc: Caren Macadam, MD  Total time on date of encounter was 45  minutes (this included time spent preparing to see the patient reviewing records; obtaining and/or reviewing separately obtained history; performing a medically appropriate exam and/or evaluation; counseling and educating the patient and family if present; ordering medications, tests or procedures if applicable; and documenting clinical information in the health record).

## 2019-11-08 NOTE — Patient Instructions (Signed)
If you are age 40 or older, your body mass index should be between 23-30. Your Body mass index is 26.15 kg/m. If this is out of the aforementioned range listed, please consider follow up with your Primary Care Provider.  If you are age 30 or younger, your body mass index should be between 19-25. Your Body mass index is 26.15 kg/m. If this is out of the aformentioned range listed, please consider follow up with your Primary Care Provider.   You have been scheduled for an endoscopy. Please follow written instructions given to you at your visit today. If you use inhalers (even only as needed), please bring them with you on the day of your procedure.  Due to recent changes in healthcare laws, you may see the results of your imaging and laboratory studies on MyChart before your provider has had a chance to review them.  We understand that in some cases there may be results that are confusing or concerning to you. Not all laboratory results come back in the same time frame and the provider may be waiting for multiple results in order to interpret others.  Please give [place] 48 hours in order for your provider to thoroughly review all the results before contacting the office for clarification of your results.   Thank you for entrusting me with your care and choosing [facility].  Dr Ardis Hughs

## 2019-11-09 ENCOUNTER — Encounter: Payer: Self-pay | Admitting: Gastroenterology

## 2019-11-14 ENCOUNTER — Other Ambulatory Visit: Payer: Self-pay

## 2019-11-14 ENCOUNTER — Ambulatory Visit (AMBULATORY_SURGERY_CENTER): Payer: No Typology Code available for payment source | Admitting: Gastroenterology

## 2019-11-14 ENCOUNTER — Encounter: Payer: Self-pay | Admitting: Gastroenterology

## 2019-11-14 VITALS — BP 102/63 | HR 71 | Temp 97.3°F | Resp 14 | Ht 66.0 in | Wt 162.0 lb

## 2019-11-14 DIAGNOSIS — R1310 Dysphagia, unspecified: Secondary | ICD-10-CM

## 2019-11-14 DIAGNOSIS — R1319 Other dysphagia: Secondary | ICD-10-CM

## 2019-11-14 DIAGNOSIS — K2100 Gastro-esophageal reflux disease with esophagitis, without bleeding: Secondary | ICD-10-CM

## 2019-11-14 MED ORDER — OMEPRAZOLE 40 MG PO CPDR: 40.0000 mg | DELAYED_RELEASE_CAPSULE | Freq: Every day | ORAL | 6 refills | Status: DC

## 2019-11-14 MED ORDER — SODIUM CHLORIDE 0.9 % IV SOLN: 500.0000 mL | Freq: Once | INTRAVENOUS | Status: DC

## 2019-11-14 MED FILL — OMEPRAZOLE 40 MG CPDR: 40 | 30 days supply | Qty: 30 | Fill #0

## 2019-11-14 NOTE — Op Note (Signed)
[facility] Patient Name: Robert Roman Procedure Date: 11/14/2019 8:27 AM MRN: [id number] Endoscopist: Milus Banister , MD Age: 39 Referring MD:  Date of Birth: 08/07/1980 Gender: Male Account #: [id number] Procedure:                Upper GI endoscopy Indications:              Dysphagia, globus Medicines:                Monitored Anesthesia Care Procedure:                Pre-Anesthesia Assessment:                           - Prior to the procedure, a History and Physical                            was performed, and patient medications and                            allergies were reviewed. The patient's tolerance of                            previous anesthesia was also reviewed. The risks                            and benefits of the procedure and the sedation                            options and risks were discussed with the patient.                            All questions were answered, and informed consent                            was obtained. Prior Anticoagulants: The patient has                            taken no previous anticoagulant or antiplatelet                            agents. ASA Grade Assessment: I - A normal, healthy                            patient. After reviewing the risks and benefits,                            the patient was deemed in satisfactory condition to                            undergo the procedure.                           After obtaining informed consent, the endoscope was  passed under direct vision. Throughout the                            procedure, the patient's blood pressure, pulse, and                            oxygen saturations were monitored continuously. The                            Endoscope was introduced through the mouth, and                            advanced to the second part of duodenum. The upper                            GI endoscopy was accomplished without difficulty.                             The patient tolerated the procedure well. Scope In: Scope Out: Findings:                 LA Grade B (one or more mucosal breaks greater than                            5 mm, not extending between the tops of two mucosal                            folds) esophagitis was found in the distal                            esophagus.                           The exam was otherwise without abnormality. Complications:            No immediate complications. Estimated blood loss:                            None. Estimated Blood Loss:     Estimated blood loss: none. Impression:               - Acid related distal esophagitis. This is very                            possibly causing your symptoms. Recommendation:           - Patient has a contact number available for                            emergencies. The signs and symptoms of potential                            delayed complications were discussed with the                            patient. Return to normal  activities tomorrow.                            Written discharge instructions were provided to the                            patient.                           - Resume previous diet.                           - Continue present medications. New script for                            omeprazole 40mg  pills one pill once daily about 30                            min before your first meal of the day. disp 30 with                            6 refills. Also please start taking OTC pepcid                            (famotidine) 20mg  pills one pill at bedtime every                            night.                           - Call Dr. Ardis Hughs' office in 6-8weeks to report on                            your response to the above regimen. Milus Banister, MD 11/14/2019 8:43:16 AM This report has been signed electronically.

## 2019-11-14 NOTE — Progress Notes (Signed)
Pt's states no medical or surgical changes since previsit or office visit. 

## 2019-11-14 NOTE — Progress Notes (Signed)
To PACU, VSS. Report to [email or web address] 

## 2019-11-14 NOTE — Patient Instructions (Signed)
Resume previous diet.  Continue present medications.  Start omeprazole 40 mg once daily about 30 minutes before your first meal of the day.  Start pepcid OTC 20 mg pills once a day at bedtime.  Call Dr. Ardis Hughs in 6-8 weeks to report on your response to this regime.  YOU HAD AN ENDOSCOPIC PROCEDURE TODAY AT [facility]:   Refer to the procedure report that was given to you for any specific questions about what was found during the examination.  If the procedure report does not answer your questions, please call your gastroenterologist to clarify.  If you requested that your care partner not be given the details of your procedure findings, then the procedure report has been included in a sealed envelope for you to review at your convenience later.  YOU SHOULD EXPECT: Some feelings of bloating in the abdomen. Passage of more gas than usual.  Walking can help get rid of the air that was put into your GI tract during the procedure and reduce the bloating. If you had a lower endoscopy (such as a colonoscopy or flexible sigmoidoscopy) you may notice spotting of blood in your stool or on the toilet paper. If you underwent a bowel prep for your procedure, you may not have a normal bowel movement for a few days.  Please Note:  You might notice some irritation and congestion in your nose or some drainage.  This is from the oxygen used during your procedure.  There is no need for concern and it should clear up in a day or so.  SYMPTOMS TO REPORT IMMEDIATELY:    Following upper endoscopy (EGD)  Vomiting of blood or coffee ground material  New chest pain or pain under the shoulder blades  Painful or persistently difficult swallowing  New shortness of breath  Fever of 100F or higher  Black, tarry-looking stools  For urgent or emergent issues, a gastroenterologist can be reached at any hour by calling [phone]. Do not use MyChart messaging for urgent concerns.    DIET:  We do  recommend a small meal at first, but then you may proceed to your regular diet.  Drink plenty of fluids but you should avoid alcoholic beverages for 24 hours.  ACTIVITY:  You should plan to take it easy for the rest of today and you should NOT DRIVE or use heavy machinery until tomorrow (because of the sedation medicines used during the test).    FOLLOW UP: Our staff will call the number listed on your records 48-72 hours following your procedure to check on you and address any questions or concerns that you may have regarding the information given to you following your procedure. If we do not reach you, we will leave a message.  We will attempt to reach you two times.  During this call, we will ask if you have developed any symptoms of COVID 19. If you develop any symptoms (ie: fever, flu-like symptoms, shortness of breath, cough etc.) before then, please call [phone].  If you test positive for Covid 19 in the 2 weeks post procedure, please call and report this information to [place].    If any biopsies were taken you will be contacted by phone or by letter within the next 1-3 weeks.  Please call us at [phone] if you have not heard about the biopsies in 3 weeks.    SIGNATURES/CONFIDENTIALITY: You and/or your care partner have signed paperwork which will be entered into your electronic medical record.  These signatures attest to the fact that that the information above on your After Visit Summary has been reviewed and is understood.  Full responsibility of the confidentiality of this discharge information lies with you and/or your care-partner.

## 2019-11-16 ENCOUNTER — Telehealth: Payer: Self-pay | Admitting: *Deleted

## 2019-11-16 NOTE — Telephone Encounter (Signed)
  Follow up Call-  Call back number 11/14/2019  Post procedure Call Back phone  # [phone]  Permission to leave phone message Yes  Some recent data might be hidden     Patient questions:  Do you have a fever, pain , or abdominal swelling? No. Pain Score  0 *  Have you tolerated food without any problems? Yes.    Have you been able to return to your normal activities? Yes.    Do you have any questions about your discharge instructions: Diet   No. Medications  No. Follow up visit  No.  Do you have questions or concerns about your Care? No.  Actions: * If pain score is 4 or above: No action needed, pain <4.  1. Have you developed a fever since your procedure? no  2.   Have you had an respiratory symptoms (SOB or cough) since your procedure? no  3.   Have you tested positive for COVID 19 since your procedure no  4.   Have you had any family members/close contacts diagnosed with the COVID 19 since your procedure?  no   If yes to any of these questions please route to Joylene John, RN and Erenest Rasher, RN

## 2019-12-13 MED FILL — OMEPRAZOLE 40 MG CPDR: 40 | 30 days supply | Qty: 30 | Fill #1

## 2020-01-16 MED FILL — OMEPRAZOLE 40 MG CPDR: 40 | 30 days supply | Qty: 30 | Fill #2

## 2020-02-28 ENCOUNTER — Telehealth: Payer: Self-pay | Admitting: Gastroenterology

## 2020-02-28 DIAGNOSIS — R1319 Other dysphagia: Secondary | ICD-10-CM

## 2020-02-29 NOTE — Telephone Encounter (Signed)
Left message on machine to call back  

## 2020-03-01 ENCOUNTER — Other Ambulatory Visit (HOSPITAL_COMMUNITY): Payer: Self-pay | Admitting: Gastroenterology

## 2020-03-01 MED ORDER — OMEPRAZOLE 40 MG PO CPDR: 40.0000 mg | DELAYED_RELEASE_CAPSULE | Freq: Two times a day (BID) | ORAL | 3 refills | Status: DC

## 2020-03-01 MED FILL — OMEPRAZOLE 40 MG CPDR: 40 | 30 days supply | Qty: 60 | Fill #0

## 2020-03-01 NOTE — Telephone Encounter (Signed)
Okay, thanks.  Lets double his omeprazole to 40 mg before breakfast and 40 mg before dinner and he should continue the Pepcid at bedtime.  He should call in 4 weeks to report on this new regimen.  He might need a new prescription for the omeprazole.  Thanks

## 2020-03-01 NOTE — Telephone Encounter (Signed)
The pt states that since starting the omeprazole 40 mg in the am and pepcid at bedtime he has not really noticed any changes and believes the reflux may even be worse.  He says he had a globus sensation that may be some better. He would like to know what he should do next.  He tells me that he is following anti reflux precautions and changed his diet.  He does not notice any difference in what he eats.  Please advise.

## 2020-03-01 NOTE — Telephone Encounter (Signed)
The pt has been advised and prescription sent to the pharmacy

## 2020-05-06 MED FILL — OMEPRAZOLE 40 MG CPDR: 40 | 30 days supply | Qty: 60 | Fill #1

## 2020-06-14 ENCOUNTER — Other Ambulatory Visit (HOSPITAL_COMMUNITY): Payer: Self-pay | Admitting: Gastroenterology

## 2020-06-14 ENCOUNTER — Telehealth: Payer: Self-pay | Admitting: Gastroenterology

## 2020-06-14 DIAGNOSIS — R1319 Other dysphagia: Secondary | ICD-10-CM

## 2020-06-14 MED ORDER — OMEPRAZOLE 40 MG PO CPDR: 40.0000 mg | DELAYED_RELEASE_CAPSULE | Freq: Two times a day (BID) | ORAL | 3 refills | Status: DC

## 2020-06-14 MED FILL — OMEPRAZOLE 40 MG CPDR: 40 | 30 days supply | Qty: 60 | Fill #0

## 2020-06-14 NOTE — Telephone Encounter (Signed)
Pt is requesting a call back from a nurse to discuss his refill on his omeprazole, pt wants to know if he should combine it with something else for it to work better.

## 2020-06-14 NOTE — Telephone Encounter (Signed)
The pt has continued reflux despite omeprazole BIS and pepcid at bedtime.  I have advised he come in for a follow up.  His omeprazole was refilled. He will call back to set up a follow up.

## 2020-07-22 ENCOUNTER — Other Ambulatory Visit (HOSPITAL_COMMUNITY): Payer: Self-pay

## 2020-07-22 MED FILL — Omeprazole Cap Delayed Release 40 MG: ORAL | 30 days supply | Qty: 60 | Fill #0 | Status: AC

## 2020-08-23 ENCOUNTER — Encounter: Payer: Self-pay | Admitting: Gastroenterology

## 2020-08-23 ENCOUNTER — Ambulatory Visit (INDEPENDENT_AMBULATORY_CARE_PROVIDER_SITE_OTHER): Payer: No Typology Code available for payment source | Admitting: Gastroenterology

## 2020-08-23 ENCOUNTER — Other Ambulatory Visit: Payer: Self-pay

## 2020-08-23 ENCOUNTER — Other Ambulatory Visit (HOSPITAL_COMMUNITY): Payer: Self-pay

## 2020-08-23 VITALS — BP 100/66 | HR 64 | Ht 65.25 in | Wt 163.5 lb

## 2020-08-23 DIAGNOSIS — R198 Other specified symptoms and signs involving the digestive system and abdomen: Secondary | ICD-10-CM

## 2020-08-23 DIAGNOSIS — R0989 Other specified symptoms and signs involving the circulatory and respiratory systems: Secondary | ICD-10-CM

## 2020-08-23 DIAGNOSIS — K219 Gastro-esophageal reflux disease without esophagitis: Secondary | ICD-10-CM | POA: Diagnosis not present

## 2020-08-23 MED ORDER — PANTOPRAZOLE SODIUM 40 MG PO TBEC
40.0000 mg | DELAYED_RELEASE_TABLET | Freq: Two times a day (BID) | ORAL | 3 refills | Status: DC
  Filled 2020-08-23: qty 180, 90d supply, fill #0
  Filled 2021-03-06: qty 180, 90d supply, fill #1

## 2020-08-23 NOTE — Progress Notes (Signed)
Review of pertinent gastrointestinal problems: 1.  Globus sensation, mild GERD..  EGD July 2021 LA grade B reflux related esophagitis.  The examination was otherwise normal.  He was recommended to take proton pump inhibitor shortly before his first meal of the day and also Pepcid 20 mg at bedtime every night.  November 2021, proton pump inhibitor was doubled  HPI: This is a very pleasant 40 year old man whom I last saw about a year ago at the time of an upper endoscopy.  See those results summarized above.  He works in Building services engineer.  He is still bothered by globus fairly often and is increasingly bothered by bloating and sensation of inability to belch that occurs about daily.  He is taking his omeprazole shortly before breakfast, with dinner.  He is taking Pepcid 20 mg with dinner as well.  He is very clear that if he skips one of his omeprazole pills a lot of his upper GI symptoms are worse.  It also seem to him that when he avoids spicy foods many of his sensations are improved as well.  He eats quite a clean diet.  He has bowel movements daily.   ROS: complete GI ROS as described in HPI, all other review negative.  Constitutional:  No unintentional weight loss   Past Medical History:  Diagnosis Date  . Allergy     Past Surgical History:  Procedure Laterality Date  . NASAL SEPTOPLASTY W/ TURBINOPLASTY Bilateral 07/19/2015   Procedure: NASAL SEPTOPLASTY WITH BILATERAL INFERIOR TURBINATE REDUCTION;  Surgeon: Jerrell Belfast, MD;  Location: [place];  Service: ENT;  Laterality: Bilateral;  . TONSILLECTOMY Bilateral 07/19/2015   Procedure: BILATERAL TONSILLECTOMY;  Surgeon: Jerrell Belfast, MD;  Location: [place];  Service: ENT;  Laterality: Bilateral;  . WISDOM TOOTH EXTRACTION      Current Outpatient Medications  Medication Sig Dispense Refill  . cetirizine (ZYRTEC) 10 MG tablet Take 10 mg by mouth daily.    . famotidine (PEPCID) 20 MG  tablet Take 20 mg by mouth at bedtime.    . fluticasone (FLONASE) 50 MCG/ACT nasal spray Place 2 sprays into both nostrils daily. (Patient taking differently: Place 2 sprays into both nostrils as needed.) 16 g 6  . omeprazole (PRILOSEC) 40 MG capsule Take 1 capsule (40 mg total) by mouth in the morning and at bedtime. 60 capsule 11   No current facility-administered medications for this visit.    Allergies as of 08/23/2020  . (No Known Allergies)    Family History  Problem Relation Age of Onset  . Thyroid disease Mother   . Irritable bowel syndrome Mother   . Hypertension Sister   . Heart disease Maternal Grandfather   . Heart disease Paternal Grandfather   . Esophageal cancer Maternal Uncle 66    Social History   Socioeconomic History  . Marital status: Married    Spouse name: Not on file  . Number of children: Not on file  . Years of education: Not on file  . Highest education level: Not on file  Occupational History  . Not on file  Tobacco Use  . Smoking status: Never Smoker  . Smokeless tobacco: Never Used  . Tobacco comment: smoked as a teenager  Substance and Sexual Activity  . Alcohol use: Yes    Alcohol/week: 0.0 standard drinks    Comment: 2 drinks a few days per week  . Drug use: No  . Sexual activity: Not on file  Other Topics Concern  .  Not on file  Social History Narrative   Work or School: Proofreader Situation: lives with wife, two kids      Spiritual Beliefs: Christian      Lifestyle: regular CV exercise 30 minutes and weight lifting 3 days per week; diet is good      Social Determinants of Radio broadcast assistant Strain: Not on file  Food Insecurity: Not on file  Transportation Needs: Not on file  Physical Activity: Not on file  Stress: Not on file  Social Connections: Not on file  Intimate Partner Violence: Not on file     Physical Exam: BP 100/66 (BP Location: Left Arm, Patient Position: Sitting, Cuff  Size: Normal)   Pulse 64   Ht 5' 5.25" (1.657 m) Comment: height measured without shoes  Wt 163 lb 8 oz (74.2 kg)   BMI 27.00 kg/m  Constitutional: generally well-appearing Psychiatric: alert and oriented x3 Abdomen: soft, nontender, nondistended, no obvious ascites, no peritoneal signs, normal bowel sounds No peripheral edema noted in lower extremities  Assessment and plan: 40 y.o. male with globus, mild GERD  I explained to him that some people clearly respond to certain proton pump inhibitors more than other proton pump inhibitors and I recommend that we change him from omeprazole to pantoprazole at the same dosing regimen, that is 40 mg twice daily shortly before breakfast and also shortly before dinner.  I also explained to him that H2 blockers like Pepcid or definitely best if taken at bedtime on a regular basis.  He is going to make these changes and he will contact us in 6 to 8 weeks to report on his response.  Please see the "Patient Instructions" section for addition details about the plan.  Owens Loffler, MD [facility] 08/23/2020, 8:57 AM   Total time on date of encounter was 30 minutes (this included time spent preparing to see the patient reviewing records; obtaining and/or reviewing separately obtained history; performing a medically appropriate exam and/or evaluation; counseling and educating the patient and family if present; ordering medications, tests or procedures if applicable; and documenting clinical information in the health record).

## 2020-08-23 NOTE — Patient Instructions (Signed)
If you are age 41 or younger, your body mass index should be between 19-25. Your Body mass index is 27 kg/m. If this is out of the aformentioned range listed, please consider follow up with your Primary Care Provider.   STOP: omeprazole  We have sent the following medications to your pharmacy for you to pick up at your convenience:  START: pantoprazole 40mg  one tablet shortly before breakfast and dinner meal each day.  Please take Pepcid (famotidine) at bedtime each night.  Please call our office or send a MyChart message in 6 to 8 weeks to update [place] on how you are feeling.  Thank you for entrusting me with your care and choosing [facility].  Dr Ardis Hughs

## 2020-09-11 ENCOUNTER — Ambulatory Visit (INDEPENDENT_AMBULATORY_CARE_PROVIDER_SITE_OTHER): Payer: No Typology Code available for payment source | Admitting: Family Medicine

## 2020-09-11 ENCOUNTER — Other Ambulatory Visit: Payer: Self-pay

## 2020-09-11 ENCOUNTER — Encounter: Payer: Self-pay | Admitting: Family Medicine

## 2020-09-11 VITALS — BP 92/68 | HR 57 | Temp 98.0°F | Ht 66.0 in | Wt 163.5 lb

## 2020-09-11 DIAGNOSIS — Z0000 Encounter for general adult medical examination without abnormal findings: Secondary | ICD-10-CM | POA: Diagnosis not present

## 2020-09-11 DIAGNOSIS — E538 Deficiency of other specified B group vitamins: Secondary | ICD-10-CM

## 2020-09-11 DIAGNOSIS — R748 Abnormal levels of other serum enzymes: Secondary | ICD-10-CM

## 2020-09-11 DIAGNOSIS — E785 Hyperlipidemia, unspecified: Secondary | ICD-10-CM | POA: Diagnosis not present

## 2020-09-11 DIAGNOSIS — K219 Gastro-esophageal reflux disease without esophagitis: Secondary | ICD-10-CM

## 2020-09-11 LAB — COMPREHENSIVE METABOLIC PANEL
ALT: 55 U/L — ABNORMAL HIGH (ref 0–53)
AST: 30 U/L (ref 0–37)
Albumin: 4.5 g/dL (ref 3.5–5.2)
Alkaline Phosphatase: 59 U/L (ref 39–117)
BUN: 16 mg/dL (ref 6–23)
CO2: 29 mEq/L (ref 19–32)
Calcium: 9.3 mg/dL (ref 8.4–10.5)
Chloride: 104 mEq/L (ref 96–112)
Creatinine, Ser: 0.95 mg/dL (ref 0.40–1.50)
GFR: 100.05 mL/min (ref >60.00)
Glucose, Bld: 95 mg/dL (ref 70–99)
Potassium: 4.2 mEq/L (ref 3.5–5.1)
Sodium: 140 mEq/L (ref 135–145)
Total Bilirubin: 0.6 mg/dL (ref 0.2–1.2)
Total Protein: 7.2 g/dL (ref 6.0–8.3)

## 2020-09-11 LAB — CBC WITH DIFFERENTIAL/PLATELET
Basophils Absolute: 0 10*3/uL (ref 0.0–0.1)
Basophils Relative: 0.5 % (ref 0.0–3.0)
Eosinophils Absolute: 0.2 10*3/uL (ref 0.0–0.7)
Eosinophils Relative: 2.8 % (ref 0.0–5.0)
HCT: 43.9 % (ref 39.0–52.0)
Hemoglobin: 15.5 g/dL (ref 13.0–17.0)
Lymphocytes Relative: 37 % (ref 12.0–46.0)
Lymphs Abs: 2 10*3/uL (ref 0.7–4.0)
MCHC: 35.2 g/dL (ref 30.0–36.0)
MCV: 85.3 fl (ref 78.0–100.0)
Monocytes Absolute: 0.6 10*3/uL (ref 0.1–1.0)
Monocytes Relative: 11.3 % (ref 3.0–12.0)
Neutro Abs: 2.7 10*3/uL (ref 1.4–7.7)
Neutrophils Relative %: 48.4 % (ref 43.0–77.0)
Platelets: 241 10*3/uL (ref 150.0–400.0)
RBC: 5.15 Mil/uL (ref 4.22–5.81)
RDW: 13.5 % (ref 11.5–15.5)
WBC: 5.5 10*3/uL (ref 4.0–10.5)

## 2020-09-11 LAB — LIPID PANEL
Cholesterol: 208 mg/dL — ABNORMAL HIGH (ref 0–200)
HDL: 32 mg/dL — ABNORMAL LOW (ref >39.00)
NonHDL: 176.08
Total CHOL/HDL Ratio: 7
Triglycerides: 300 mg/dL — ABNORMAL HIGH (ref 0.0–149.0)
VLDL: 60 mg/dL — ABNORMAL HIGH (ref 0.0–40.0)

## 2020-09-11 LAB — VITAMIN B12: Vitamin B-12: 462 pg/mL (ref 211–911)

## 2020-09-11 LAB — LDL CHOLESTEROL, DIRECT: Direct LDL: 122 mg/dL

## 2020-09-11 LAB — TSH: TSH: 1.63 u[IU]/mL (ref 0.35–4.50)

## 2020-09-11 NOTE — Addendum Note (Signed)
Addended by: Elmer Picker on: 09/11/2020 08:35 AM   Modules accepted: Orders

## 2020-09-11 NOTE — Progress Notes (Signed)
Robert Roman DOB: January 24, 1981 Encounter date: 09/11/2020  This is a 40 y.o. male who presents for complete physical   History of present illness/Additional concerns: Last visit with me was 05/2019  Reflux is still ongoing issue - more like globus feeling in throat. Following with GI; had upper GI last august. Started protonix recently and feels that is doing a little better. He does have ENT that he can follow with if desired.   Just light sleeper in general.   Does exercise about 4 days/week.   Follows with Dr. Amy Martinique for skin checks.    Past Medical History:  Diagnosis Date  . Allergy    Past Surgical History:  Procedure Laterality Date  . NASAL SEPTOPLASTY W/ TURBINOPLASTY Bilateral 07/19/2015   Procedure: NASAL SEPTOPLASTY WITH BILATERAL INFERIOR TURBINATE REDUCTION;  Surgeon: Jerrell Belfast, MD;  Location: [place];  Service: ENT;  Laterality: Bilateral;  . TONSILLECTOMY Bilateral 07/19/2015   Procedure: BILATERAL TONSILLECTOMY;  Surgeon: Jerrell Belfast, MD;  Location: [place];  Service: ENT;  Laterality: Bilateral;  . VASECTOMY  2019  . WISDOM TOOTH EXTRACTION     No Known Allergies Current Meds  Medication Sig  . cetirizine (ZYRTEC) 10 MG tablet Take 10 mg by mouth daily.  . famotidine (PEPCID) 20 MG tablet Take 20 mg by mouth at bedtime.  . fluticasone (FLONASE) 50 MCG/ACT nasal spray Place 2 sprays into both nostrils daily. (Patient taking differently: Place 2 sprays into both nostrils as needed.)  . pantoprazole (PROTONIX) 40 MG tablet Take 1 tablet (40 mg total) by mouth 2 (two) times daily before a meal. Take shortly before breakfast meal and dinner meal each day.   Social History   Tobacco Use  . Smoking status: Former Research scientist (life sciences)  . Smokeless tobacco: Never Used  . Tobacco comment: smoked as a teenager  Substance Use Topics  . Alcohol use: Yes    Alcohol/week: 0.0 standard drinks    Comment: 2 drinks a few days per week    Family History  Problem Relation Age of Onset  . Thyroid disease Mother   . Irritable bowel syndrome Mother   . Breast cancer Mother 53  . Hypertension Sister   . Breast cancer Sister 47  . Heart disease Maternal Grandfather   . Heart disease Paternal Grandfather   . Esophageal cancer Maternal Uncle 58     Review of Systems  Constitutional: Negative for activity change, appetite change, chills, fatigue, fever and unexpected weight change.  HENT: Negative for congestion, ear pain, hearing loss, sinus pressure, sinus pain, sore throat and trouble swallowing.   Eyes: Negative for pain and visual disturbance.  Respiratory: Negative for cough, chest tightness, shortness of breath and wheezing.   Cardiovascular: Negative for chest pain, palpitations and leg swelling.  Gastrointestinal: Negative for abdominal distention, abdominal pain, blood in stool, constipation, diarrhea, nausea and vomiting.  Genitourinary: Negative for decreased urine volume, difficulty urinating, dysuria, penile pain and testicular pain.  Musculoskeletal: Negative for arthralgias (sometimes in hands; saw rheumatology. osteoarthritis. not on meds for this. avoids holding arterial pressure.), back pain and joint swelling.  Skin: Negative for rash.  Neurological: Negative for dizziness, weakness, numbness and headaches.  Hematological: Negative for adenopathy. Does not bruise/bleed easily.  Psychiatric/Behavioral: Negative for agitation, sleep disturbance and suicidal ideas. The patient is not nervous/anxious.     CBC:  Lab Results  Component Value Date   WBC 5.1 06/15/2019   HGB 15.3 06/15/2019   HCT 43.3  06/15/2019   MCH 30.0 11/04/2015   MCHC 35.2 06/15/2019   RDW 13.0 06/15/2019   PLT 243.0 06/15/2019   MPV 7.0 11/04/2015   CMP: Lab Results  Component Value Date   NA 139 06/15/2019   K 4.0 06/15/2019   CL 104 06/15/2019   CO2 28 06/15/2019   GLUCOSE 96 06/15/2019   BUN 12 06/15/2019   CREATININE  1.00 06/15/2019   CALCIUM 9.6 06/15/2019   PROT 7.1 06/15/2019   BILITOT 0.4 06/15/2019   ALKPHOS 57 06/15/2019   ALT 40 06/15/2019   AST 24 06/15/2019   LIPID: Lab Results  Component Value Date   CHOL 204 (H) 06/15/2019   TRIG 271.0 (H) 06/15/2019   HDL 32.70 (L) 06/15/2019   LDLCALC 139 (H) 03/08/2014    Objective:  BP 92/68 (BP Location: Left Arm, Patient Position: Sitting, Cuff Size: Normal)   Pulse (!) 57   Temp 98 F (36.7 C) (Oral)   Ht 5\' 6"  (1.676 m)   Wt 163 lb 8 oz (74.2 kg)   SpO2 99%   BMI 26.39 kg/m   Weight: 163 lb 8 oz (74.2 kg)   BP Readings from Last 3 Encounters:  09/11/20 92/68  08/23/20 100/66  11/14/19 (!) 102/63   Wt Readings from Last 3 Encounters:  09/11/20 163 lb 8 oz (74.2 kg)  08/23/20 163 lb 8 oz (74.2 kg)  11/14/19 162 lb (73.5 kg)    Physical Exam Constitutional:      General: He is not in acute distress.    Appearance: He is well-developed.  HENT:     Head: Normocephalic and atraumatic.     Right Ear: External ear normal.     Left Ear: External ear normal.     Nose: Nose normal.     Mouth/Throat:     Pharynx: No oropharyngeal exudate.  Eyes:     Conjunctiva/sclera: Conjunctivae normal.     Pupils: Pupils are equal, round, and reactive to light.  Neck:     Thyroid: No thyromegaly.  Cardiovascular:     Rate and Rhythm: Normal rate and regular rhythm.     Heart sounds: Normal heart sounds. No murmur heard. No friction rub. No gallop.   Pulmonary:     Effort: Pulmonary effort is normal. No respiratory distress.     Breath sounds: Normal breath sounds. No stridor. No wheezing or rales.  Abdominal:     General: Bowel sounds are normal.     Palpations: Abdomen is soft.  Genitourinary:    Penis: Normal and circumcised.      Testes: Normal.  Musculoskeletal:        General: Normal range of motion.     Cervical back: Neck supple.  Skin:    General: Skin is warm and dry.  Neurological:     Mental Status: He is alert and  oriented to person, place, and time.  Psychiatric:        Behavior: Behavior normal.        Thought Content: Thought content normal.        Judgment: Judgment normal.     Assessment/Plan: Health Maintenance Due  Topic Date Due  . Hepatitis C Screening  Never done  . TETANUS/TDAP  09/11/2017   Health Maintenance reviewed.  1. Preventative health care Keep up with regular exercise and well rounded eating. We will recheck bloodwork today.   2. Hyperlipidemia, unspecified hyperlipidemia type - Lipid panel; Future - Comprehensive metabolic panel; Future - TSH; Future  3. Gastroesophageal reflux disease, unspecified whether esophagitis present He is on protonix now; seems to be doing well with this. Continue follow up with GI.  - CBC with Differential/Platelet; Future  4. B12 deficiency Checking levels due to chronic GERD/PPI - Vitamin B12; Future  Return in about 1 year (around 09/11/2021) for physical exam.  Micheline Rough, MD

## 2020-09-18 NOTE — Addendum Note (Signed)
Addended by: Agnes Lawrence on: 09/18/2020 03:22 PM   Modules accepted: Orders

## 2020-11-13 ENCOUNTER — Other Ambulatory Visit (HOSPITAL_BASED_OUTPATIENT_CLINIC_OR_DEPARTMENT_OTHER): Payer: Self-pay

## 2020-11-13 MED ORDER — COVID-19 AT HOME ANTIGEN TEST VI KIT
PACK | 0 refills | Status: DC
  Filled 2020-11-13: qty 4, 8d supply, fill #0

## 2020-12-17 ENCOUNTER — Other Ambulatory Visit (HOSPITAL_BASED_OUTPATIENT_CLINIC_OR_DEPARTMENT_OTHER): Payer: Self-pay

## 2020-12-17 MED ORDER — COVID-19 AT HOME ANTIGEN TEST VI KIT
PACK | 0 refills | Status: AC
  Filled 2020-12-17: qty 2, 4d supply, fill #0

## 2020-12-25 ENCOUNTER — Other Ambulatory Visit: Payer: Self-pay

## 2020-12-25 ENCOUNTER — Other Ambulatory Visit (INDEPENDENT_AMBULATORY_CARE_PROVIDER_SITE_OTHER): Payer: No Typology Code available for payment source

## 2020-12-25 DIAGNOSIS — R748 Abnormal levels of other serum enzymes: Secondary | ICD-10-CM | POA: Diagnosis not present

## 2020-12-25 LAB — HEPATIC FUNCTION PANEL
ALT: 25 U/L (ref 0–53)
AST: 18 U/L (ref 0–37)
Albumin: 4.3 g/dL (ref 3.5–5.2)
Alkaline Phosphatase: 73 U/L (ref 39–117)
Bilirubin, Direct: 0.1 mg/dL (ref 0.0–0.3)
Total Bilirubin: 0.5 mg/dL (ref 0.2–1.2)
Total Protein: 7.2 g/dL (ref 6.0–8.3)

## 2020-12-25 LAB — GAMMA GT: GGT: 26 U/L (ref 7–51)

## 2021-02-13 ENCOUNTER — Encounter: Payer: Self-pay | Admitting: Family Medicine

## 2021-02-13 DIAGNOSIS — E785 Hyperlipidemia, unspecified: Secondary | ICD-10-CM

## 2021-02-13 NOTE — Telephone Encounter (Signed)
Last OV 09/11/20 with lab result note: "would suggest transitioning diet to more mediterranean style and making sure getting in daily exercise and then do a more elaborate cholesterol panel in follow up (like 6 months)".   No lab orders noted, will f/u with PCP>

## 2021-02-14 NOTE — Addendum Note (Signed)
Addended by: Caren Macadam on: 02/14/2021 06:02 PM   Modules accepted: Orders

## 2021-02-25 ENCOUNTER — Ambulatory Visit: Payer: No Typology Code available for payment source | Attending: Internal Medicine

## 2021-02-25 ENCOUNTER — Other Ambulatory Visit (HOSPITAL_BASED_OUTPATIENT_CLINIC_OR_DEPARTMENT_OTHER): Payer: Self-pay

## 2021-02-25 DIAGNOSIS — Z23 Encounter for immunization: Secondary | ICD-10-CM

## 2021-02-25 MED ORDER — PFIZER COVID-19 VAC BIVALENT 30 MCG/0.3ML IM SUSP
INTRAMUSCULAR | 0 refills | Status: AC
  Filled 2021-02-25: qty 0.3, 1d supply, fill #0

## 2021-02-25 NOTE — Progress Notes (Signed)
   Covid-19 Vaccination Clinic  Name:  TREYON WYMORE    MRN: [id number] DOB: [birth year]  02/25/2021  Mr. Peacock was observed post Covid-19 immunization for 15 minutes without incident. He was provided with Vaccine Information Sheet and instruction to access the V-Safe system.   Mr. Burdo was instructed to call 911 with any severe reactions post vaccine: Difficulty breathing  Swelling of face and throat  A fast heartbeat  A bad rash all over body  Dizziness and weakness   Immunizations Administered     Name Date Dose VIS Date Route   Pfizer Covid-19 Vaccine Bivalent Booster 02/25/2021 10:07 AM 0.3 mL 12/18/2020 Intramuscular   Manufacturer: [place]   Lot: [id number]   [place]: [phone]

## 2021-03-06 ENCOUNTER — Other Ambulatory Visit (HOSPITAL_COMMUNITY): Payer: Self-pay

## 2021-08-22 ENCOUNTER — Other Ambulatory Visit (HOSPITAL_BASED_OUTPATIENT_CLINIC_OR_DEPARTMENT_OTHER): Payer: Self-pay

## 2021-08-22 ENCOUNTER — Encounter: Payer: Self-pay | Admitting: Family Medicine

## 2021-08-22 ENCOUNTER — Ambulatory Visit (INDEPENDENT_AMBULATORY_CARE_PROVIDER_SITE_OTHER): Payer: No Typology Code available for payment source | Admitting: Family Medicine

## 2021-08-22 VITALS — BP 112/80 | HR 62 | Temp 98.2°F | Ht 65.0 in | Wt 157.2 lb

## 2021-08-22 DIAGNOSIS — Z8042 Family history of malignant neoplasm of prostate: Secondary | ICD-10-CM | POA: Diagnosis not present

## 2021-08-22 DIAGNOSIS — R748 Abnormal levels of other serum enzymes: Secondary | ICD-10-CM

## 2021-08-22 DIAGNOSIS — Z23 Encounter for immunization: Secondary | ICD-10-CM

## 2021-08-22 DIAGNOSIS — J069 Acute upper respiratory infection, unspecified: Secondary | ICD-10-CM

## 2021-08-22 DIAGNOSIS — E785 Hyperlipidemia, unspecified: Secondary | ICD-10-CM

## 2021-08-22 DIAGNOSIS — K219 Gastro-esophageal reflux disease without esophagitis: Secondary | ICD-10-CM | POA: Diagnosis not present

## 2021-08-22 DIAGNOSIS — Z0000 Encounter for general adult medical examination without abnormal findings: Secondary | ICD-10-CM

## 2021-08-22 LAB — COMPREHENSIVE METABOLIC PANEL
ALT: 30 U/L (ref 0–53)
AST: 20 U/L (ref 0–37)
Albumin: 4.7 g/dL (ref 3.5–5.2)
Alkaline Phosphatase: 53 U/L (ref 39–117)
BUN: 17 mg/dL (ref 6–23)
CO2: 28 mEq/L (ref 19–32)
Calcium: 9.5 mg/dL (ref 8.4–10.5)
Chloride: 104 mEq/L (ref 96–112)
Creatinine, Ser: 1.08 mg/dL (ref 0.40–1.50)
GFR: 85.21 mL/min (ref >60.00)
Glucose, Bld: 92 mg/dL (ref 70–99)
Potassium: 4.5 mEq/L (ref 3.5–5.1)
Sodium: 141 mEq/L (ref 135–145)
Total Bilirubin: 0.6 mg/dL (ref 0.2–1.2)
Total Protein: 7.5 g/dL (ref 6.0–8.3)

## 2021-08-22 LAB — PSA: PSA: 1.53 ng/mL (ref 0.10–4.00)

## 2021-08-22 MED ORDER — FLUTICASONE PROPIONATE 50 MCG/ACT NA SUSP
2.0000 | NASAL | 11 refills | Status: AC | PRN
Start: 2021-08-22 — End: ?
  Filled 2021-08-22: qty 16, 30d supply, fill #0

## 2021-08-22 MED ORDER — PANTOPRAZOLE SODIUM 40 MG PO TBEC
40.0000 mg | DELAYED_RELEASE_TABLET | Freq: Two times a day (BID) | ORAL | 3 refills | Status: DC
  Filled 2021-08-22: qty 180, 90d supply, fill #0
  Filled 2022-06-07: qty 180, 90d supply, fill #1

## 2021-08-22 NOTE — Progress Notes (Signed)
Robert Roman ?DOB: [birth year] ?Encounter date: 08/22/2021 ? ?This is a 41 y.o. male who presents for complete physical  ? ?History of present illness/Additional concerns: ?Last visit was 1 year ago for physical. ? ?GERD: He had upper endoscopy 10/2019:LA Grade B (one or more mucosal breaks greater than 5 mm, not extending between the tops of two mucosal folds) esophagitis was found in the distal esophagus. Medications work for most part, but if he eats trigger foods, gets some symptoms. Usually just the protonix, pepcid if needed. Protonix 86m BID.does see GI, but not in last year.  ? ?Seasonal allergies: requires daily zyrtec or he gets sx. Flonase just with seasonal change. ? ?Follows with Dr. JMartiniqueregularly for dermatology management. ? ?Past Medical History:  ?Diagnosis Date  ? Allergy   ? ?Past Surgical History:  ?Procedure Laterality Date  ? NASAL SEPTOPLASTY W/ TURBINOPLASTY Bilateral 07/19/2015  ? Procedure: NASAL SEPTOPLASTY WITH BILATERAL INFERIOR TURBINATE REDUCTION;  Surgeon: DJerrell Belfast MD;  Location[place]  Service: ENT;  Laterality: Bilateral;  ? TONSILLECTOMY Bilateral 07/19/2015  ? Procedure: BILATERAL TONSILLECTOMY;  Surgeon: DJerrell Belfast MD;  [place]  Service: ENT;  Laterality: Bilateral;  ? VASECTOMY  2019  ? WISDOM TOOTH EXTRACTION    ? ?No Known Allergies ?Current Meds  ?Medication Sig  ? cetirizine (ZYRTEC) 10 MG tablet Take 10 mg by mouth daily.  ? COVID-19 At Home Antigen Test KIT Use as directed  ? COVID-19 mRNA bivalent vaccine, Pfizer, (PFIZER COVID-19 VAC BIVALENT) injection Inject into the muscle.  ? famotidine (PEPCID) 20 MG tablet Take 20 mg by mouth at bedtime.  ? [DISCONTINUED] fluticasone (FLONASE) 50 MCG/ACT nasal spray Place 2 sprays into both nostrils daily. (Patient taking differently: Place 2 sprays into both nostrils as needed.)  ? [DISCONTINUED] pantoprazole (PROTONIX) 40 MG tablet Take 1 tablet (40 mg total) by mouth  2 (two) times daily before a meal. Take shortly before breakfast meal and dinner meal each day.  ? ?Social History  ? ?Tobacco Use  ? Smoking status: Former  ? Smokeless tobacco: Never  ? Tobacco comments:  ?  smoked as a teenager  ?Substance Use Topics  ? Alcohol use: Yes  ?  Alcohol/week: 0.0 standard drinks  ?  Comment: 2 drinks a few days per week  ? ?Family History  ?Problem Relation Age of Onset  ? Thyroid disease Mother   ? Irritable bowel syndrome Mother   ? Breast cancer Mother [phone] ? Other Father   ?     drug overdose, pneumonia  ? Hypertension Sister   ? Breast cancer Sister 455 ? Heart disease Maternal Grandfather   ?     had triple bypass in late 50's  ? Prostate cancer Maternal Grandfather   ? Heart disease Paternal Grandfather [phone] ? Esophageal cancer Maternal Uncle 541 ? ? ? ?Review of Systems  ?Constitutional:  Negative for activity change, appetite change, chills, fatigue, fever and unexpected weight change.  ?HENT:  Negative for congestion, ear pain, hearing loss, sinus pressure, sinus pain, sore throat and trouble swallowing.   ?Eyes:  Negative for pain and visual disturbance.  ?Respiratory:  Negative for cough, chest tightness, shortness of breath and wheezing.   ?Cardiovascular:  Negative for chest pain, palpitations and leg swelling.  ?Gastrointestinal:  Negative for abdominal distention, abdominal pain, blood in stool, constipation, diarrhea, nausea and vomiting.  ?Genitourinary:  Negative for decreased urine volume, difficulty urinating, dysuria, penile  pain and testicular pain.  ?Musculoskeletal:  Negative for arthralgias, back pain and joint swelling.  ?Skin:  Negative for rash.  ?Neurological:  Negative for dizziness, weakness, numbness and headaches.  ?Hematological:  Negative for adenopathy. Does not bruise/bleed easily.  ?Psychiatric/Behavioral:  Negative for agitation, sleep disturbance and suicidal ideas. The patient is not nervous/anxious.   ? ?CBC:  ?Lab Results  ?Component Value  Date  ? WBC 5.5 09/11/2020  ? HGB 15.5 09/11/2020  ? HCT 43.9 09/11/2020  ? MCH 30.0 11/04/2015  ? MCHC 35.2 09/11/2020  ? RDW 13.5 09/11/2020  ? PLT 241.0 09/11/2020  ? MPV 7.0 11/04/2015  ? ?CMP: ?Lab Results  ?Component Value Date  ? NA 140 09/11/2020  ? K 4.2 09/11/2020  ? CL 104 09/11/2020  ? CO2 29 09/11/2020  ? GLUCOSE 95 09/11/2020  ? BUN 16 09/11/2020  ? CREATININE 0.95 09/11/2020  ? CALCIUM 9.3 09/11/2020  ? PROT 7.2 12/25/2020  ? BILITOT 0.5 12/25/2020  ? ALKPHOS 73 12/25/2020  ? ALT 25 12/25/2020  ? AST 18 12/25/2020  ? ?LIPID: ?Lab Results  ?Component Value Date  ? CHOL 208 (H) 09/11/2020  ? TRIG 300.0 (H) 09/11/2020  ? HDL 32.00 (L) 09/11/2020  ? LDLCALC 139 (H) 03/08/2014  ? ? ?Objective: ? ?BP 112/80 (BP Location: Left Arm, Patient Position: Sitting, Cuff Size: Normal)   Pulse 62   Temp 98.2 ?F (36.8 ?C) (Oral)   Ht 5' 5"  (1.651 m)   Wt 157 lb 3.2 oz (71.3 kg)   SpO2 98%   BMI 26.16 kg/m?   Weight: 157 lb 3.2 oz (71.3 kg)  ? ?BP Readings from Last 3 Encounters:  ?08/22/21 112/80  ?09/11/20 92/68  ?08/23/20 100/66  ? ?Wt Readings from Last 3 Encounters:  ?08/22/21 157 lb 3.2 oz (71.3 kg)  ?09/11/20 163 lb 8 oz (74.2 kg)  ?08/23/20 163 lb 8 oz (74.2 kg)  ? ? ?Physical Exam ?Constitutional:   ?   General: He is not in acute distress. ?   Appearance: He is well-developed.  ?HENT:  ?   Head: Normocephalic and atraumatic.  ?   Right Ear: External ear normal.  ?   Left Ear: External ear normal.  ?   Nose: Nose normal.  ?   Mouth/Throat:  ?   Pharynx: No oropharyngeal exudate.  ?Eyes:  ?   Conjunctiva/sclera: Conjunctivae normal.  ?   Pupils: Pupils are equal, round, and reactive to light.  ?Neck:  ?   Thyroid: No thyromegaly.  ?Cardiovascular:  ?   Rate and Rhythm: Normal rate and regular rhythm.  ?   Heart sounds: Normal heart sounds. No murmur heard. ?  No friction rub. No gallop.  ?Pulmonary:  ?   Effort: Pulmonary effort is normal. No respiratory distress.  ?   Breath sounds: Normal breath sounds.  No stridor. No wheezing or rales.  ?Abdominal:  ?   General: Bowel sounds are normal.  ?   Palpations: Abdomen is soft.  ?Musculoskeletal:     ?   General: Normal range of motion.  ?   Cervical back: Neck supple.  ?Skin: ?   General: Skin is warm and dry.  ?Neurological:  ?   Mental Status: He is alert and oriented to person, place, and time.  ?Psychiatric:     ?   Behavior: Behavior normal.     ?   Thought Content: Thought content normal.     ?   Judgment: Judgment normal.  ? ? ?  Assessment/Plan: ?There are no preventive care reminders to display for this patient. ? ?Health Maintenance reviewed. ? ?1. Preventative health care ?Work on getting back with regular exercise.  ? ?2. Gastroesophageal reflux disease, unspecified whether esophagitis present ?Continue with protonix. Pepcid prn. ? ?3. Hyperlipidemia, unspecified hyperlipidemia type ?Will recheck today. Consider coronary calcium screening.  ? ? ?Return in about 1 year (around 08/23/2022) for physical exam. ? ?Micheline Rough, MD ? ? ? ? ? ?

## 2021-08-24 LAB — NMR, LIPOPROFILE
Cholesterol, Total: 212 mg/dL — ABNORMAL HIGH (ref 100–199)
HDL Particle Number: 29.3 umol/L — ABNORMAL LOW (ref >=30.5)
HDL-C: 38 mg/dL — ABNORMAL LOW (ref >39)
LDL Particle Number: 1802 nmol/L — ABNORMAL HIGH (ref <1000)
LDL Size: 20.1 nm — ABNORMAL LOW (ref >20.5)
LDL-C (NIH Calc): 129 mg/dL — ABNORMAL HIGH (ref 0–99)
LP-IR Score: 59 — ABNORMAL HIGH (ref <=45)
Small LDL Particle Number: 984 nmol/L — ABNORMAL HIGH (ref <=527)
Triglycerides: 251 mg/dL — ABNORMAL HIGH (ref 0–149)

## 2022-08-31 ENCOUNTER — Other Ambulatory Visit (HOSPITAL_BASED_OUTPATIENT_CLINIC_OR_DEPARTMENT_OTHER): Payer: Self-pay

## 2022-08-31 ENCOUNTER — Other Ambulatory Visit: Payer: Self-pay

## 2022-09-06 ENCOUNTER — Other Ambulatory Visit: Payer: Self-pay

## 2022-09-06 ENCOUNTER — Other Ambulatory Visit (HOSPITAL_BASED_OUTPATIENT_CLINIC_OR_DEPARTMENT_OTHER): Payer: Self-pay

## 2022-09-08 ENCOUNTER — Other Ambulatory Visit (HOSPITAL_BASED_OUTPATIENT_CLINIC_OR_DEPARTMENT_OTHER): Payer: Self-pay

## 2022-09-11 DIAGNOSIS — L858 Other specified epidermal thickening: Secondary | ICD-10-CM | POA: Diagnosis not present

## 2022-09-11 DIAGNOSIS — D225 Melanocytic nevi of trunk: Secondary | ICD-10-CM | POA: Diagnosis not present

## 2022-09-11 DIAGNOSIS — D2362 Other benign neoplasm of skin of left upper limb, including shoulder: Secondary | ICD-10-CM | POA: Diagnosis not present

## 2022-09-11 DIAGNOSIS — L820 Inflamed seborrheic keratosis: Secondary | ICD-10-CM | POA: Diagnosis not present

## 2022-09-11 DIAGNOSIS — L218 Other seborrheic dermatitis: Secondary | ICD-10-CM | POA: Diagnosis not present

## 2022-10-01 ENCOUNTER — Other Ambulatory Visit (HOSPITAL_BASED_OUTPATIENT_CLINIC_OR_DEPARTMENT_OTHER): Payer: Self-pay

## 2022-10-12 ENCOUNTER — Other Ambulatory Visit (HOSPITAL_BASED_OUTPATIENT_CLINIC_OR_DEPARTMENT_OTHER): Payer: Self-pay

## 2022-10-31 ENCOUNTER — Other Ambulatory Visit: Payer: Self-pay

## 2022-10-31 ENCOUNTER — Other Ambulatory Visit (HOSPITAL_BASED_OUTPATIENT_CLINIC_OR_DEPARTMENT_OTHER): Payer: Self-pay

## 2022-11-12 DIAGNOSIS — H5203 Hypermetropia, bilateral: Secondary | ICD-10-CM | POA: Diagnosis not present

## 2022-11-12 DIAGNOSIS — H52221 Regular astigmatism, right eye: Secondary | ICD-10-CM | POA: Diagnosis not present

## 2022-11-12 DIAGNOSIS — H5231 Anisometropia: Secondary | ICD-10-CM | POA: Diagnosis not present

## 2022-11-23 ENCOUNTER — Ambulatory Visit (INDEPENDENT_AMBULATORY_CARE_PROVIDER_SITE_OTHER): Payer: 59 | Admitting: Family Medicine

## 2022-11-23 ENCOUNTER — Other Ambulatory Visit (HOSPITAL_BASED_OUTPATIENT_CLINIC_OR_DEPARTMENT_OTHER): Payer: Self-pay

## 2022-11-23 ENCOUNTER — Encounter: Payer: Self-pay | Admitting: Family Medicine

## 2022-11-23 VITALS — BP 104/80 | HR 60 | Temp 97.7°F | Ht 65.0 in | Wt 162.4 lb

## 2022-11-23 DIAGNOSIS — Z0000 Encounter for general adult medical examination without abnormal findings: Secondary | ICD-10-CM

## 2022-11-23 DIAGNOSIS — Z13220 Encounter for screening for lipoid disorders: Secondary | ICD-10-CM

## 2022-11-23 DIAGNOSIS — K219 Gastro-esophageal reflux disease without esophagitis: Secondary | ICD-10-CM | POA: Diagnosis not present

## 2022-11-23 LAB — COMPREHENSIVE METABOLIC PANEL
ALT: 30 U/L (ref 0–53)
AST: 23 U/L (ref 0–37)
Albumin: 4.3 g/dL (ref 3.5–5.2)
Alkaline Phosphatase: 60 U/L (ref 39–117)
BUN: 11 mg/dL (ref 6–23)
CO2: 26 mEq/L (ref 19–32)
Calcium: 9.2 mg/dL (ref 8.4–10.5)
Chloride: 105 mEq/L (ref 96–112)
Creatinine, Ser: 1.11 mg/dL (ref 0.40–1.50)
GFR: 81.74 mL/min (ref >60.00)
Glucose, Bld: 98 mg/dL (ref 70–99)
Potassium: 4.2 mEq/L (ref 3.5–5.1)
Sodium: 139 mEq/L (ref 135–145)
Total Bilirubin: 0.4 mg/dL (ref 0.2–1.2)
Total Protein: 7.4 g/dL (ref 6.0–8.3)

## 2022-11-23 LAB — CBC WITH DIFFERENTIAL/PLATELET
Basophils Absolute: 0 10*3/uL (ref 0.0–0.1)
Basophils Relative: 0.5 % (ref 0.0–3.0)
Eosinophils Absolute: 0.2 10*3/uL (ref 0.0–0.7)
Eosinophils Relative: 2.8 % (ref 0.0–5.0)
HCT: 43.1 % (ref 39.0–52.0)
Hemoglobin: 14.7 g/dL (ref 13.0–17.0)
Lymphocytes Relative: 38.4 % (ref 12.0–46.0)
Lymphs Abs: 2.2 10*3/uL (ref 0.7–4.0)
MCHC: 34.2 g/dL (ref 30.0–36.0)
MCV: 86.8 fl (ref 78.0–100.0)
Monocytes Absolute: 0.6 10*3/uL (ref 0.1–1.0)
Monocytes Relative: 11.1 % (ref 3.0–12.0)
Neutro Abs: 2.7 10*3/uL (ref 1.4–7.7)
Neutrophils Relative %: 47.2 % (ref 43.0–77.0)
Platelets: 247 10*3/uL (ref 150.0–400.0)
RBC: 4.96 Mil/uL (ref 4.22–5.81)
RDW: 13.4 % (ref 11.5–15.5)
WBC: 5.7 10*3/uL (ref 4.0–10.5)

## 2022-11-23 LAB — LIPID PANEL
Cholesterol: 187 mg/dL (ref 0–200)
HDL: 32.3 mg/dL — ABNORMAL LOW (ref >39.00)
Total CHOL/HDL Ratio: 6
Triglycerides: 439 mg/dL — ABNORMAL HIGH (ref 0.0–149.0)

## 2022-11-23 LAB — LDL CHOLESTEROL, DIRECT: Direct LDL: 86 mg/dL

## 2022-11-23 MED ORDER — PANTOPRAZOLE SODIUM 40 MG PO TBEC
40.0000 mg | DELAYED_RELEASE_TABLET | Freq: Every day | ORAL | 3 refills | Status: DC
  Filled 2022-11-23 – 2023-01-26 (×2): qty 90, 90d supply, fill #0
  Filled 2023-06-07: qty 90, 90d supply, fill #1
  Filled 2023-09-29 – 2023-10-14 (×2): qty 90, 90d supply, fill #2

## 2022-11-23 NOTE — Progress Notes (Signed)
Complete physical exam  Patient: Robert Roman   DOB: 1980-05-04   42 y.o. Male  MRN: [id number]  Subjective:    Chief Complaint  Patient presents with   Establish Care    Robert Roman is a 42 y.o. male who presents today for a complete physical exam. He reports consuming a general diet. Home exercise routine includes rowing machine at home, normally 4 days a week 30 minutes per session. He generally feels well. He reports sleeping well. He does not have additional problems to discuss today.    Most recent fall risk assessment:    11/04/2015   10:18 AM  Fall Risk   Falls in the past year? No     Most recent depression screenings:    11/23/2022    8:26 AM 08/22/2021    8:00 AM  PHQ 2/9 Scores  PHQ - 2 Score 0 0  PHQ- 9 Score 0 0    Vision:Within last year and Dental: No current dental problems and Receives regular dental care patient is being monitored for signs of glaucoma, had elevated eye pressures.   Patient Active Problem List   Diagnosis Date Noted   GERD (gastroesophageal reflux disease) 11/06/2019   Pain in right hand 07/12/2017   Trigger finger 07/12/2017   Post-tonsillectomy hemorrhage 07/25/2015   Deviated nasal septum 07/19/2015   Hypertrophy of tonsils 07/19/2015   Allergic rhinitis 03/08/2014   Eczema 06/22/2006      Patient Care Team: Karie Georges, MD as PCP - General (Family Medicine) [place], Amy, MD as Consulting Physician (Dermatology) Rachael Fee, MD as Attending Physician (Gastroenterology) Dominica Severin, MD as Consulting Physician (Orthopedic Surgery) Pollyann Savoy, MD as Consulting Physician (Rheumatology)   Outpatient Medications Prior to Visit  Medication Sig   cetirizine (ZYRTEC) 10 MG tablet Take 10 mg by mouth daily.   COVID-19 At Home Antigen Test KIT Use as directed   COVID-19 mRNA bivalent vaccine, Pfizer, (PFIZER COVID-19 VAC BIVALENT) injection Inject into the muscle.   fluticasone (FLONASE) 50 MCG/ACT nasal spray  Place 2 sprays into both nostrils as needed.   [DISCONTINUED] pantoprazole (PROTONIX) 40 MG tablet Take 1 tablet (40 mg total) by mouth 2 (two) times daily before a meal. Take shortly before breakfast meal and dinner meal each day.   [DISCONTINUED] famotidine (PEPCID) 20 MG tablet Take 20 mg by mouth at bedtime.   No facility-administered medications prior to visit.    Review of Systems  HENT:  Negative for hearing loss.   Eyes:  Negative for blurred vision.  Respiratory:  Negative for shortness of breath.   Cardiovascular:  Negative for chest pain.  Gastrointestinal: Negative.   Genitourinary: Negative.   Musculoskeletal:  Negative for back pain.  Neurological:  Negative for headaches.  Psychiatric/Behavioral:  Negative for depression.   All other systems reviewed and are negative.      Objective:     BP 104/80 (BP Location: Left Arm, Patient Position: Sitting, Cuff Size: Normal)   Pulse 60   Temp 97.7 F (36.5 C) (Oral)   Ht 5\' 5"  (1.651 m)   Wt 162 lb 6.4 oz (73.7 kg)   BMI 27.02 kg/m    Physical Exam Vitals reviewed.  Constitutional:      Appearance: Normal appearance. He is well-groomed and normal weight.  HENT:     Right Ear: Tympanic membrane and ear canal normal.     Left Ear: Tympanic membrane and ear canal normal.     Mouth/Throat:  Mouth: Mucous membranes are moist.     Pharynx: No posterior oropharyngeal erythema.  Eyes:     Extraocular Movements: Extraocular movements intact.     Conjunctiva/sclera: Conjunctivae normal.  Neck:     Thyroid: No thyromegaly.  Cardiovascular:     Rate and Rhythm: Normal rate and regular rhythm.     Heart sounds: S1 normal and S2 normal. No murmur heard. Pulmonary:     Effort: Pulmonary effort is normal.     Breath sounds: Normal breath sounds and air entry. No rales.  Abdominal:     General: Abdomen is flat. Bowel sounds are normal.  Musculoskeletal:     Right lower leg: No edema.     Left lower leg: No edema.   Lymphadenopathy:     Cervical: No cervical adenopathy.  Neurological:     General: No focal deficit present.     Mental Status: He is alert and oriented to person, place, and time.     Gait: Gait is intact.  Psychiatric:        Mood and Affect: Mood and affect normal.      No results found for any visits on 11/23/22.     Assessment & Plan:    Routine Health Maintenance and Physical Exam  Immunization History  Administered Date(s) Administered   Influenza,inj,Quad PF,6+ Mos 01/22/2021   Influenza-Unspecified 01/14/2014, 01/18/2017, 01/19/2019   PFIZER(Purple Top)SARS-COV-2 Vaccination 04/12/2019, 04/30/2019, 01/19/2020   PPD Test 06/22/2006, 06/14/2007   Pfizer Covid-19 Vaccine Bivalent Booster 51yrs & up 02/25/2021   Tdap 09/12/2007, 08/22/2021    Health Maintenance  Topic Date Due   Hepatitis C Screening  Never done   INFLUENZA VACCINE  11/19/2022   COVID-19 Vaccine (5 - 2023-24 season) 12/09/2022 (Originally 12/19/2021)   DTaP/Tdap/Td (3 - Td or Tdap) 08/23/2031   HIV Screening  Completed   HPV VACCINES  Aged Out    Discussed health benefits of physical activity, and encouraged him to engage in regular exercise appropriate for his age and condition.  Need for lipid screening -     Lipid panel  Gastroesophageal reflux disease, unspecified whether esophagitis present -     Pantoprazole Sodium; Take 1 tablet (40 mg total) by mouth daily. Take shortly before breakfast meal and dinner meal each day.  Dispense: 90 tablet; Refill: 3  Preventative health care -     Comprehensive metabolic panel -     CBC with Differential/Platelet  Normal physical exam findings today, counseled patient on the COVID and flu shots for the fall, pt needed refills on his pantoprazole today, is taking it once a day for her severe GERD. Handouts given on healthy eating and exercise.   No follow-ups on file.     Karie Georges, MD

## 2022-12-04 ENCOUNTER — Other Ambulatory Visit (HOSPITAL_BASED_OUTPATIENT_CLINIC_OR_DEPARTMENT_OTHER): Payer: Self-pay

## 2023-01-26 ENCOUNTER — Other Ambulatory Visit: Payer: Self-pay

## 2023-01-27 ENCOUNTER — Other Ambulatory Visit (HOSPITAL_BASED_OUTPATIENT_CLINIC_OR_DEPARTMENT_OTHER): Payer: Self-pay

## 2023-06-08 ENCOUNTER — Other Ambulatory Visit (HOSPITAL_BASED_OUTPATIENT_CLINIC_OR_DEPARTMENT_OTHER): Payer: Self-pay

## 2023-09-30 ENCOUNTER — Other Ambulatory Visit (HOSPITAL_BASED_OUTPATIENT_CLINIC_OR_DEPARTMENT_OTHER): Payer: Self-pay

## 2023-10-11 ENCOUNTER — Other Ambulatory Visit (HOSPITAL_BASED_OUTPATIENT_CLINIC_OR_DEPARTMENT_OTHER): Payer: Self-pay

## 2023-10-14 ENCOUNTER — Other Ambulatory Visit (HOSPITAL_BASED_OUTPATIENT_CLINIC_OR_DEPARTMENT_OTHER): Payer: Self-pay

## 2023-11-24 ENCOUNTER — Other Ambulatory Visit (HOSPITAL_BASED_OUTPATIENT_CLINIC_OR_DEPARTMENT_OTHER): Payer: Self-pay

## 2023-11-24 MED ORDER — VALACYCLOVIR HCL 1 G PO TABS
1000.0000 mg | ORAL_TABLET | Freq: Two times a day (BID) | ORAL | 0 refills | Status: AC
  Filled 2023-11-24: qty 20, 10d supply, fill #0

## 2023-11-29 ENCOUNTER — Ambulatory Visit: Payer: Self-pay | Admitting: Family Medicine

## 2023-11-29 ENCOUNTER — Encounter: Payer: Self-pay | Admitting: Family Medicine

## 2023-11-29 ENCOUNTER — Ambulatory Visit (INDEPENDENT_AMBULATORY_CARE_PROVIDER_SITE_OTHER): Admitting: Family Medicine

## 2023-11-29 VITALS — BP 110/70 | HR 64 | Temp 97.7°F | Ht 65.0 in | Wt 159.2 lb

## 2023-11-29 DIAGNOSIS — Z0000 Encounter for general adult medical examination without abnormal findings: Secondary | ICD-10-CM | POA: Diagnosis not present

## 2023-11-29 DIAGNOSIS — Z8249 Family history of ischemic heart disease and other diseases of the circulatory system: Secondary | ICD-10-CM

## 2023-11-29 DIAGNOSIS — Z131 Encounter for screening for diabetes mellitus: Secondary | ICD-10-CM

## 2023-11-29 DIAGNOSIS — E782 Mixed hyperlipidemia: Secondary | ICD-10-CM

## 2023-11-29 LAB — COMPREHENSIVE METABOLIC PANEL WITH GFR
ALT: 28 U/L (ref 0–53)
AST: 22 U/L (ref 0–37)
Albumin: 4.3 g/dL (ref 3.5–5.2)
Alkaline Phosphatase: 52 U/L (ref 39–117)
BUN: 13 mg/dL (ref 6–23)
CO2: 27 meq/L (ref 19–32)
Calcium: 9.1 mg/dL (ref 8.4–10.5)
Chloride: 106 meq/L (ref 96–112)
Creatinine, Ser: 1.01 mg/dL (ref 0.40–1.50)
GFR: 90.89 mL/min (ref >60.00)
Glucose, Bld: 98 mg/dL (ref 70–99)
Potassium: 4.2 meq/L (ref 3.5–5.1)
Sodium: 140 meq/L (ref 135–145)
Total Bilirubin: 0.4 mg/dL (ref 0.2–1.2)
Total Protein: 7 g/dL (ref 6.0–8.3)

## 2023-11-29 LAB — CBC WITH DIFFERENTIAL/PLATELET
Basophils Absolute: 0 K/uL (ref 0.0–0.1)
Basophils Relative: 0.4 % (ref 0.0–3.0)
Eosinophils Absolute: 0.1 K/uL (ref 0.0–0.7)
Eosinophils Relative: 2.3 % (ref 0.0–5.0)
HCT: 42.7 % (ref 39.0–52.0)
Hemoglobin: 14.9 g/dL (ref 13.0–17.0)
Lymphocytes Relative: 37.3 % (ref 12.0–46.0)
Lymphs Abs: 1.9 K/uL (ref 0.7–4.0)
MCHC: 34.9 g/dL (ref 30.0–36.0)
MCV: 86.4 fl (ref 78.0–100.0)
Monocytes Absolute: 0.5 K/uL (ref 0.1–1.0)
Monocytes Relative: 10.7 % (ref 3.0–12.0)
Neutro Abs: 2.5 K/uL (ref 1.4–7.7)
Neutrophils Relative %: 49.3 % (ref 43.0–77.0)
Platelets: 229 K/uL (ref 150.0–400.0)
RBC: 4.94 Mil/uL (ref 4.22–5.81)
RDW: 13.3 % (ref 11.5–15.5)
WBC: 5.1 K/uL (ref 4.0–10.5)

## 2023-11-29 LAB — LIPID PANEL
Cholesterol: 210 mg/dL — ABNORMAL HIGH (ref 0–200)
HDL: 33.2 mg/dL — ABNORMAL LOW (ref >39.00)
LDL Cholesterol: 118 mg/dL — ABNORMAL HIGH (ref 0–99)
NonHDL: 176.75
Total CHOL/HDL Ratio: 6
Triglycerides: 296 mg/dL — ABNORMAL HIGH (ref 0.0–149.0)
VLDL: 59.2 mg/dL — ABNORMAL HIGH (ref 0.0–40.0)

## 2023-11-29 LAB — HEMOGLOBIN A1C: Hgb A1c MFr Bld: 5.2 % (ref 4.6–6.5)

## 2023-11-29 NOTE — Patient Instructions (Signed)
 Health Maintenance, Male  Adopting a healthy lifestyle and getting preventive care are important in promoting health and wellness. Ask your health care provider about:  The right schedule for you to have regular tests and exams.  Things you can do on your own to prevent diseases and keep yourself healthy.  What should I know about diet, weight, and exercise?  Eat a healthy diet    Eat a diet that includes plenty of vegetables, fruits, low-fat dairy products, and lean protein.  Do not eat a lot of foods that are high in solid fats, added sugars, or sodium.  Maintain a healthy weight  Body mass index (BMI) is a measurement that can be used to identify possible weight problems. It estimates body fat based on height and weight. Your health care provider can help determine your BMI and help you achieve or maintain a healthy weight.  Get regular exercise  Get regular exercise. This is one of the most important things you can do for your health. Most adults should:  Exercise for at least 150 minutes each week. The exercise should increase your heart rate and make you sweat (moderate-intensity exercise).  Do strengthening exercises at least twice a week. This is in addition to the moderate-intensity exercise.  Spend less time sitting. Even light physical activity can be beneficial.  Watch cholesterol and blood lipids  Have your blood tested for lipids and cholesterol at 44 years of age, then have this test every 5 years.  You may need to have your cholesterol levels checked more often if:  Your lipid or cholesterol levels are high.  You are older than 44 years of age.  You are at high risk for heart disease.  What should I know about cancer screening?  Many types of cancers can be detected early and may often be prevented. Depending on your health history and family history, you may need to have cancer screening at various ages. This may include screening for:  Colorectal cancer.  Prostate cancer.  Skin cancer.  Lung  cancer.  What should I know about heart disease, diabetes, and high blood pressure?  Blood pressure and heart disease  High blood pressure causes heart disease and increases the risk of stroke. This is more likely to develop in people who have high blood pressure readings or are overweight.  Talk with your health care provider about your target blood pressure readings.  Have your blood pressure checked:  Every 3-5 years if you are 24-52 years of age.  Every year if you are 3 years old or older.  If you are between the ages of 60 and 72 and are a current or former smoker, ask your health care provider if you should have a one-time screening for abdominal aortic aneurysm (AAA).  Diabetes  Have regular diabetes screenings. This checks your fasting blood sugar level. Have the screening done:  Once every three years after age 66 if you are at a normal weight and have a low risk for diabetes.  More often and at a younger age if you are overweight or have a high risk for diabetes.  What should I know about preventing infection?  Hepatitis B  If you have a higher risk for hepatitis B, you should be screened for this virus. Talk with your health care provider to find out if you are at risk for hepatitis B infection.  Hepatitis C  Blood testing is recommended for:  Everyone born from [birth year] through 1966.  Anyone  with known risk factors for hepatitis C.  Sexually transmitted infections (STIs)  You should be screened each year for STIs, including gonorrhea and chlamydia, if:  You are sexually active and are younger than 44 years of age.  You are older than 44 years of age and your health care provider tells you that you are at risk for this type of infection.  Your sexual activity has changed since you were last screened, and you are at increased risk for chlamydia or gonorrhea. Ask your health care provider if you are at risk.  Ask your health care provider about whether you are at high risk for HIV. Your health care provider  may recommend a prescription medicine to help prevent HIV infection. If you choose to take medicine to prevent HIV, you should first get tested for HIV. You should then be tested every 3 months for as long as you are taking the medicine.  Follow these instructions at home:  Alcohol use  Do not drink alcohol if your health care provider tells you not to drink.  If you drink alcohol:  Limit how much you have to 0-2 drinks a day.  Know how much alcohol is in your drink. In the [place] one drink equals one 12 oz bottle of beer (355 mL), one 5 oz glass of wine (148 mL), or one 1 oz glass of hard liquor (44 mL).  Lifestyle  Do not use any products that contain nicotine or tobacco. These products include cigarettes, chewing tobacco, and vaping devices, such as e-cigarettes. If you need help quitting, ask your health care provider.  Do not use street drugs.  Do not share needles.  Ask your health care provider for help if you need support or information about quitting drugs.  General instructions  Schedule regular health, dental, and eye exams.  Stay current with your vaccines.  Tell your health care provider if:  You often feel depressed.  You have ever been abused or do not feel safe at home.  Summary  Adopting a healthy lifestyle and getting preventive care are important in promoting health and wellness.  Follow your health care provider's instructions about healthy diet, exercising, and getting tested or screened for diseases.  Follow your health care provider's instructions on monitoring your cholesterol and blood pressure.  This information is not intended to replace advice given to you by your health care provider. Make sure you discuss any questions you have with your health care provider.  Document Revised: 08/26/2020 Document Reviewed: 08/26/2020  Elsevier Patient Education  2024 [employer].

## 2023-11-29 NOTE — Progress Notes (Signed)
 Complete physical exam  Patient: Robert Roman   DOB: Apr 07, 1981   43 y.o. Male  MRN: [id number]  Subjective:    Chief Complaint  Patient presents with   Annual Exam    Robert Roman is a 43 y.o. male who presents today for a complete physical exam. He reports consuming a general diet, eats good protein, fruits and veggies, stays away from processed foods. Home exercise routine includes uses bowflex and rowing machine. He generally feels well. He reports sleeping uses a noise machine and fan, describes himself as a light sleeper, feels rested about 50% of the time. He does not have additional problems to discuss today.    Most recent fall risk assessment:    11/04/2015   10:18 AM  Fall Risk   Falls in the past year? No      Data saved with a previous flowsheet row definition     Most recent depression screenings:    11/29/2023    7:58 AM 11/23/2022    8:26 AM  PHQ 2/9 Scores  PHQ - 2 Score 0 0  PHQ- 9 Score 0 0    Vision:Within last year and Dental: No current dental problems and Receives regular dental care  Patient Active Problem List   Diagnosis Date Noted   GERD (gastroesophageal reflux disease) 11/06/2019   Pain in right hand 07/12/2017   Trigger finger 07/12/2017   Post-tonsillectomy hemorrhage 07/25/2015   Deviated nasal septum 07/19/2015   Hypertrophy of tonsils 07/19/2015   Allergic rhinitis 03/08/2014   Eczema 06/22/2006      Patient Care Team: Robert Heron CHRISTELLA, MD as PCP - General (Family Medicine) [place], Amy, MD as Consulting Physician (Dermatology) Teressa Toribio SQUIBB, MD (Inactive) as Attending Physician (Gastroenterology) Camella Fallow, MD as Consulting Physician (Orthopedic Surgery) Dolphus Reiter, MD as Consulting Physician (Rheumatology)   Outpatient Medications Prior to Visit  Medication Sig   cetirizine (ZYRTEC) 10 MG tablet Take 10 mg by mouth daily.   COVID-19 At Home Antigen Test KIT Use as directed   COVID-19 mRNA bivalent vaccine,  Pfizer, (PFIZER COVID-19 VAC BIVALENT) injection Inject into the muscle.   pantoprazole  (PROTONIX ) 40 MG tablet Take 1 tablet (40 mg total) by mouth daily. Take shortly before breakfast meal   valACYclovir  (VALTREX ) 1000 MG tablet Take 1 tablet (1,000 mg total) by mouth 2 (two) times daily.   fluticasone  (FLONASE ) 50 MCG/ACT nasal spray Place 2 sprays into both nostrils as needed.   No facility-administered medications prior to visit.    Review of Systems  HENT:  Negative for hearing loss.   Eyes:  Negative for blurred vision.  Respiratory:  Negative for shortness of breath.   Cardiovascular:  Negative for chest pain.  Gastrointestinal: Negative.   Genitourinary: Negative.   Musculoskeletal:  Negative for back pain.  Neurological:  Negative for headaches.  Psychiatric/Behavioral:  Negative for depression.        Objective:     BP 110/70   Pulse 64   Temp 97.7 F (36.5 C) (Oral)   Ht 5' 5 (1.651 m)   Wt 159 lb 3.2 oz (72.2 kg)   SpO2 100%   BMI 26.49 kg/m    Physical Exam Vitals reviewed.  Constitutional:      Appearance: Normal appearance. He is well-groomed and normal weight.  HENT:     Right Ear: Tympanic membrane and ear canal normal.     Left Ear: Tympanic membrane and ear canal normal.  Mouth/Throat:     Mouth: Mucous membranes are moist.     Pharynx: No posterior oropharyngeal erythema.  Eyes:     Extraocular Movements: Extraocular movements intact.     Conjunctiva/sclera: Conjunctivae normal.  Neck:     Thyroid: No thyromegaly.  Cardiovascular:     Rate and Rhythm: Normal rate and regular rhythm.     Heart sounds: S1 normal and S2 normal. No murmur heard. Pulmonary:     Effort: Pulmonary effort is normal.     Breath sounds: Normal breath sounds and air entry. No rales.  Abdominal:     General: Abdomen is flat. Bowel sounds are normal.  Musculoskeletal:     Right lower leg: No edema.     Left lower leg: No edema.  Lymphadenopathy:     Cervical:  No cervical adenopathy.  Neurological:     General: No focal deficit present.     Mental Status: He is alert and oriented to person, place, and time.     Gait: Gait is intact.  Psychiatric:        Mood and Affect: Mood and affect normal.      No results found for any visits on 11/29/23.     Assessment & Plan:    Routine Health Maintenance and Physical Exam  Immunization History  Administered Date(s) Administered   Fluzone Influenza virus vaccine,trivalent (IIV3), split virus 03/11/2007   Influenza,inj,Quad PF,6+ Mos 01/22/2021   Influenza-Unspecified 01/14/2014, 01/18/2017, 01/19/2019   PFIZER(Purple Top)SARS-COV-2 Vaccination 04/12/2019, 04/30/2019, 01/19/2020   PPD Test 06/22/2006, 06/14/2007   Pfizer Covid-19 Vaccine Bivalent Booster 50yrs & up 02/25/2021   Tdap 09/12/2007, 08/22/2021    Health Maintenance  Topic Date Due   Hepatitis B Vaccines (1 of 3 - 19+ 3-dose series) Never done   HPV VACCINES (1 - 3-dose SCDM series) Never done   COVID-19 Vaccine (5 - 2024-25 season) 12/20/2022   INFLUENZA VACCINE  11/19/2023   Hepatitis C Screening  11/28/2024 (Originally 02/02/1998)   DTaP/Tdap/Td (3 - Td or Tdap) 08/23/2031   HIV Screening  Completed   Meningococcal B Vaccine  Aged Out    Discussed health benefits of physical activity, and encouraged him to engage in regular exercise appropriate for his age and condition.  Mixed hyperlipidemia -     Lipid panel; Future -     Apolipoprotein A-1; Future -     Apolipoprotein B; Future  Diabetes mellitus screening -     Hemoglobin A1c; Future  Routine general medical examination at a health care facility -     CBC with Differential/Platelet; Future -     Comprehensive metabolic panel with GFR; Future  Family history of heart disease -     Apolipoprotein A-1; Future -     Apolipoprotein B; Future  Normal physical exam findings. I counseled the patient on the recommended amount of exercise per CDC recommendation. We had  a longer conversation about HLD and his overall risk, I advised that we order Apo a and Apo b to better evaluate.  I reviewed preventative screening, immunizations, and medical history and updated in the chart, and appropriate labs and vaccinations were ordered. Handouts given on healthy eating and exercise.    Return in 1 year (on 11/28/2024).     Heron Roman Sharper, MD

## 2023-11-30 LAB — APOLIPOPROTEIN A-1: Apolipoprotein A-1: 116 mg/dL (ref 101–178)

## 2023-11-30 LAB — APOLIPOPROTEIN B: Apolipoprotein B: 125 mg/dL — ABNORMAL HIGH (ref <90)

## 2023-12-03 ENCOUNTER — Other Ambulatory Visit (HOSPITAL_BASED_OUTPATIENT_CLINIC_OR_DEPARTMENT_OTHER): Payer: Self-pay

## 2023-12-03 MED ORDER — ROSUVASTATIN CALCIUM 5 MG PO TABS
5.0000 mg | ORAL_TABLET | Freq: Every day | ORAL | 1 refills | Status: DC
  Filled 2023-12-03: qty 90, 90d supply, fill #0
  Filled 2024-02-27: qty 90, 90d supply, fill #1

## 2024-02-09 ENCOUNTER — Other Ambulatory Visit (HOSPITAL_BASED_OUTPATIENT_CLINIC_OR_DEPARTMENT_OTHER): Payer: Self-pay

## 2024-02-09 ENCOUNTER — Other Ambulatory Visit: Payer: Self-pay | Admitting: Family Medicine

## 2024-02-09 DIAGNOSIS — K219 Gastro-esophageal reflux disease without esophagitis: Secondary | ICD-10-CM

## 2024-02-09 MED ORDER — PANTOPRAZOLE SODIUM 40 MG PO TBEC
40.0000 mg | DELAYED_RELEASE_TABLET | Freq: Every day | ORAL | 3 refills | Status: AC
  Filled 2024-02-09: qty 90, 90d supply, fill #0
  Filled 2024-05-04: qty 90, 90d supply, fill #1

## 2024-03-20 ENCOUNTER — Telehealth: Admitting: Emergency Medicine

## 2024-03-20 ENCOUNTER — Other Ambulatory Visit (HOSPITAL_BASED_OUTPATIENT_CLINIC_OR_DEPARTMENT_OTHER): Payer: Self-pay

## 2024-03-20 DIAGNOSIS — M546 Pain in thoracic spine: Secondary | ICD-10-CM

## 2024-03-20 MED ORDER — NAPROXEN 500 MG PO TABS
500.0000 mg | ORAL_TABLET | Freq: Two times a day (BID) | ORAL | 0 refills | Status: AC
  Filled 2024-03-20: qty 14, 7d supply, fill #0

## 2024-03-20 MED ORDER — CYCLOBENZAPRINE HCL 10 MG PO TABS
10.0000 mg | ORAL_TABLET | Freq: Three times a day (TID) | ORAL | 0 refills | Status: AC | PRN
  Filled 2024-03-20: qty 21, 7d supply, fill #0

## 2024-03-20 NOTE — Progress Notes (Unsigned)

## 2024-03-21 ENCOUNTER — Telehealth: Payer: Self-pay | Admitting: Family Medicine

## 2024-03-21 NOTE — Telephone Encounter (Signed)
 Copied from CRM [id number]. Topic: Appointments - Scheduling Inquiry for Clinic >> Mar 21, 2024 11:23 AM Jasmin G wrote: Reason for CRM: Pt requested a call back at [phone] to schedule an appt for a 3 month follow up lab appt.

## 2024-03-21 NOTE — Telephone Encounter (Signed)
 Pt did not come back in nov for blood work please put undated lab order in. Pt is sch for 03-24-2024

## 2024-03-21 NOTE — Telephone Encounter (Signed)
 No new orders placed as previous lab orders expire on 12/02/2024.

## 2024-03-23 NOTE — Telephone Encounter (Signed)
Pt has been sch

## 2024-03-24 ENCOUNTER — Ambulatory Visit: Payer: Self-pay | Admitting: Family Medicine

## 2024-03-24 ENCOUNTER — Other Ambulatory Visit

## 2024-03-24 DIAGNOSIS — E782 Mixed hyperlipidemia: Secondary | ICD-10-CM | POA: Diagnosis not present

## 2024-03-24 LAB — HEPATIC FUNCTION PANEL
ALT: 61 U/L — ABNORMAL HIGH (ref 0–53)
AST: 33 U/L (ref 0–37)
Albumin: 4.6 g/dL (ref 3.5–5.2)
Alkaline Phosphatase: 48 U/L (ref 39–117)
Bilirubin, Direct: 0.1 mg/dL (ref 0.0–0.3)
Total Bilirubin: 0.5 mg/dL (ref 0.2–1.2)
Total Protein: 7.4 g/dL (ref 6.0–8.3)

## 2024-03-24 LAB — LIPID PANEL
Cholesterol: 145 mg/dL (ref 0–200)
HDL: 33.5 mg/dL — ABNORMAL LOW (ref >39.00)
LDL Cholesterol: 73 mg/dL (ref 0–99)
NonHDL: 111.28
Total CHOL/HDL Ratio: 4
Triglycerides: 192 mg/dL — ABNORMAL HIGH (ref 0.0–149.0)
VLDL: 38.4 mg/dL (ref 0.0–40.0)

## 2024-05-05 ENCOUNTER — Other Ambulatory Visit (HOSPITAL_BASED_OUTPATIENT_CLINIC_OR_DEPARTMENT_OTHER): Payer: Self-pay

## 2024-05-26 ENCOUNTER — Other Ambulatory Visit: Payer: Self-pay | Admitting: Family Medicine

## 2024-05-26 ENCOUNTER — Other Ambulatory Visit (HOSPITAL_BASED_OUTPATIENT_CLINIC_OR_DEPARTMENT_OTHER): Payer: Self-pay

## 2024-05-26 DIAGNOSIS — E782 Mixed hyperlipidemia: Secondary | ICD-10-CM

## 2024-05-26 MED ORDER — ROSUVASTATIN CALCIUM 5 MG PO TABS
5.0000 mg | ORAL_TABLET | Freq: Every day | ORAL | 1 refills | Status: AC
  Filled 2024-05-26: qty 90, 90d supply, fill #0
